# Patient Record
Sex: Female | Born: 1940
Health system: Southern US, Community
[De-identification: ages and names within clinical notes are randomized; demographics above are authoritative.]

## PROBLEM LIST (undated history)

## (undated) DIAGNOSIS — I219 Acute myocardial infarction, unspecified: Secondary | ICD-10-CM

## (undated) DIAGNOSIS — N200 Calculus of kidney: Secondary | ICD-10-CM

## (undated) DIAGNOSIS — E7800 Pure hypercholesterolemia, unspecified: Secondary | ICD-10-CM

## (undated) DIAGNOSIS — Z972 Presence of dental prosthetic device (complete) (partial): Secondary | ICD-10-CM

## (undated) DIAGNOSIS — E119 Type 2 diabetes mellitus without complications: Secondary | ICD-10-CM

## (undated) DIAGNOSIS — Z87442 Personal history of urinary calculi: Secondary | ICD-10-CM

## (undated) DIAGNOSIS — M1990 Unspecified osteoarthritis, unspecified site: Secondary | ICD-10-CM

## (undated) DIAGNOSIS — I10 Essential (primary) hypertension: Secondary | ICD-10-CM

## (undated) DIAGNOSIS — R42 Dizziness and giddiness: Secondary | ICD-10-CM

## (undated) HISTORY — PX: TUBAL LIGATION: SHX77

---

## 2004-10-22 ENCOUNTER — Ambulatory Visit: Payer: Self-pay | Admitting: General Practice

## 2005-12-17 ENCOUNTER — Ambulatory Visit: Payer: Self-pay | Admitting: General Practice

## 2006-10-07 ENCOUNTER — Ambulatory Visit: Payer: Self-pay | Admitting: Gastroenterology

## 2008-01-30 ENCOUNTER — Ambulatory Visit: Payer: Self-pay | Admitting: Family Medicine

## 2008-09-28 ENCOUNTER — Ambulatory Visit: Payer: Self-pay | Admitting: Family Medicine

## 2009-06-01 DIAGNOSIS — I219 Acute myocardial infarction, unspecified: Secondary | ICD-10-CM

## 2009-06-01 HISTORY — DX: Acute myocardial infarction, unspecified: I21.9

## 2009-06-01 HISTORY — PX: CARDIAC CATHETERIZATION: SHX172

## 2009-06-20 ENCOUNTER — Ambulatory Visit: Payer: Self-pay | Admitting: Family Medicine

## 2009-07-02 ENCOUNTER — Ambulatory Visit: Payer: Self-pay | Admitting: Family Medicine

## 2010-10-15 ENCOUNTER — Inpatient Hospital Stay: Payer: Self-pay | Admitting: *Deleted

## 2012-02-28 ENCOUNTER — Observation Stay: Payer: Self-pay | Admitting: Specialist

## 2012-02-28 LAB — URINALYSIS, COMPLETE
Bacteria: NONE SEEN
Blood: NEGATIVE
Glucose,UR: NEGATIVE mg/dL (ref 0–75)
Ketone: NEGATIVE
Protein: NEGATIVE
RBC,UR: 1 /HPF (ref 0–5)
Specific Gravity: 1.019 (ref 1.003–1.030)
Squamous Epithelial: 3
WBC UR: 1 /HPF (ref 0–5)

## 2012-02-28 LAB — COMPREHENSIVE METABOLIC PANEL
Albumin: 3.7 g/dL (ref 3.4–5.0)
Alkaline Phosphatase: 103 U/L (ref 50–136)
BUN: 13 mg/dL (ref 7–18)
Bilirubin,Total: 0.2 mg/dL (ref 0.2–1.0)
Calcium, Total: 8.9 mg/dL (ref 8.5–10.1)
Chloride: 108 mmol/L — ABNORMAL HIGH (ref 98–107)
EGFR (African American): >60
EGFR (Non-African Amer.): >60
Glucose: 156 mg/dL — ABNORMAL HIGH (ref 65–99)
Osmolality: 294 (ref 275–301)
Potassium: 3.6 mmol/L (ref 3.5–5.1)
Sodium: 146 mmol/L — ABNORMAL HIGH (ref 136–145)

## 2012-02-28 LAB — PRO B NATRIURETIC PEPTIDE: B-Type Natriuretic Peptide: 48 pg/mL (ref 0–125)

## 2012-02-28 LAB — CBC
MCH: 28 pg (ref 26.0–34.0)
MCHC: 33.2 g/dL (ref 32.0–36.0)
MCV: 85 fL (ref 80–100)
Platelet: 279 10*3/uL (ref 150–440)
RBC: 4.35 10*6/uL (ref 3.80–5.20)
RDW: 14.1 % (ref 11.5–14.5)

## 2012-02-28 LAB — CK TOTAL AND CKMB (NOT AT ARMC): CK-MB: 1.6 ng/mL (ref 0.5–3.6)

## 2012-03-01 ENCOUNTER — Ambulatory Visit: Payer: Self-pay | Admitting: Specialist

## 2012-03-04 ENCOUNTER — Ambulatory Visit: Payer: Self-pay | Admitting: Internal Medicine

## 2014-09-18 NOTE — H&P (Signed)
PATIENT NAME:  Lenore MannerWILKINS, Matricia M MR#:  [id number] DATE OF BIRTH:  [birth year]  DATE OF ADMISSION:  02/28/2012  PRIMARY CARE PHYSICIAN: Dr. Randa LynnLamb REFERRING PHYSICIAN: Dr. Darnelle CatalanMalinda   ADDENDUM  ALLERGIES: No.   MEDICATIONS:  1. Zocor 40 mg p.o. daily.  2. Plavix 75 mg p.o. daily. 3. Omega 3 fatty acid once daily. 4. Nitroglycerin 0.4 mg 1 tablet sublingual every five minutes p.r.n.  5. Metformin 500 mg p.o. daily.  6. Lopressor 50 mg p.o. 0.5 tablet b.i.d.  7. HCTZ 1 tablet p.o. daily. 8. Ecotrin 325 mg p.o. daily. 9. Norvasc 5 mg p.o. daily.   ____________________________ Shaune PollackQing Damichael Hofman, MD qc:cms D: 02/28/2012 21:22:14 ET T: 02/29/2012 06:29:44 ET JOB#: [id number]  cc: Shaune PollackQing Brendan Gadson, MD, <Dictator> Reola MosherAndrew S. Randa LynnLamb, MD   Shaune PollackQING Breylen Agyeman MD ELECTRONICALLY SIGNED 02/29/2012 15:08

## 2014-09-18 NOTE — H&P (Signed)
PATIENT NAME:  Wendy Odom, Wendy Odom MR#:  [id number] DATE OF BIRTH:  1941/01/26  DATE OF ADMISSION:  02/28/2012  PRIMARY CARE PHYSICIAN: Dr. Randa Lynn REFERRING PHYSICIAN: Dr. Darnelle Catalan  CHIEF COMPLAINT: Passed out today.   HISTORY OF PRESENT ILLNESS: 74 year old African American female with history of coronary artery disease status post stent this year, hypertension, diabetes, hyperlipidemia, history of PVCs, gastroesophageal reflux disease presented to ED with syncope episode today. Patient is alert, awake, oriented in no acute distress. According to patient she went to church today and felt dizzy, wanted to go out but passed out for a few minutes and recovered. Patient has mild headache and dizziness after syncope but denies any seizure, shaking or incontinence. No chest pain, palpitation, orthopnea, or nocturnal dyspnea. Patient denies any dysuria, hematuria. No abdominal pain but has mild nausea. No vomiting or diarrhea.   PAST MEDICAL HISTORY:  1. Hypertension.  2. Diabetes.  3. Coronary artery disease.  4. Gastroesophageal reflux disease.  FAMILY HISTORY: Father had multiple cerebrovascular accident and son with end-stage renal disease and died of status post cardiac arrest.   SOCIAL HISTORY: Denies any smoking, alcohol drinking or illicit drugs.   PAST SURGICAL HISTORY: Cholecystectomy.   REVIEW OF SYSTEMS: CONSTITUTIONAL: Patient denies any fever, chills but has headache, dizziness and weakness. EYES: No double vision, blurred vision. ENT: No postnasal drip, slurred speech or dysphagia. No postnasal drip. CARDIOVASCULAR: No chest pain, palpitation, orthopnea or nocturnal dyspnea. PULMONARY: No cough, sputum, shortness of breath, or hemoptysis. GASTROINTESTINAL: Positive for nausea but no abdominal pain, vomiting, or diarrhea. No melena or bloody stool. GENITOURINARY: No dysuria, hematuria, or incontinence. SKIN: No rash or jaundice. HEMATOLOGY: No easy bruising, bleeding. PSYCH: No depression or  anxiety. NEUROLOGY: Positive for syncope, loss of consciousness, but no seizure but has dizziness and headache.   PHYSICAL EXAMINATION:  VITAL SIGNS: Temperature 97.7, blood pressure was 195/78, now is 184/76, pulse 59, respirations 20, oxygen saturation 97% on room air.   GENERAL: Patient is alert, awake, oriented in no acute distress.   HEENT: Pupils round, equal, reactive to light, accommodation. Moist oral mucosa. Clear oropharynx.   NECK: Supple. No JVD or carotid bruit. No lymphadenopathy. No thyromegaly.   CARDIOVASCULAR: S1, S2 regular rate, rhythm. No murmurs, gallops.   PULMONARY: Bilateral air entry. No wheezing, rales. No use of accessory muscles to breathe.   ABDOMEN: Soft. No distention or tenderness. No organomegaly. Bowel sounds present.   EXTREMITIES: No edema, clubbing or cyanosis. No calf tenderness. Strong bilateral pedal pulses.   SKIN: No rash or jaundice.   NEUROLOGY: Alert and oriented x3. No focal deficit. Power 5/5. Sensation intact.   LABORATORY, DIAGNOSTIC AND RADIOLOGICAL DATA:  CAT scan of head no acute intracranial abnormality.   Urinalysis is negative. BNP 48, CK 187. CBC normal. Glucose 156, BUN 13, creatinine 0.84. Sodium 146, potassium 3.6, chloride 108, bicarbonate 30, troponin less than 0.02. EKG showed sinus rhythm with PVCs at 61 beats per minute.   IMPRESSION:  1. Syncope possibly due to vasovagal reaction.  2. Accelerated hypertension.  3. Diabetes.  4. Coronary artery disease.   PLAN OF TREATMENT:  1. Patient will be placed for observation. Will continue telemonitor. Will get carotid duplex. Patient said she had echocardiogram this year which was normal.  2. We will continue hypertension medication and will give hydralazine p.r.n. IV.  3. Continue aspirin, Plavix, statin.  4. For diabetes we will start sliding scale.  5. GI and deep vein thrombosis prophylaxis. Patient may be  discharged tomorrow if test is negative.   Discussed  patient's situation and plan of treatment with patient and patient's husband.   TIME SPENT: About 45 minutes.   ____________________________ Shaune PollackQing Asencion Loveday, MD qc:cms D: 02/28/2012 21:20:40 ET T: 02/29/2012 06:18:23 ET JOB#: [id number]  cc: Shaune PollackQing Braysen Cloward, MD, <Dictator> Reola MosherAndrew S. Randa LynnLamb, MD  Shaune PollackQING Ondre Salvetti MD ELECTRONICALLY SIGNED 02/29/2012 15:08

## 2014-09-18 NOTE — Discharge Summary (Signed)
PATIENT NAME:  Wendy Odom, Wendy Odom MR#:  [id number] DATE OF BIRTH:  1941-02-29  DATE OF ADMISSION:  02/28/2012 DATE OF DISCHARGE:  02/29/2012  HISTORY AND PHYSICAL: For a detailed note, please take a look at the History and Physical done on admission by Dr. Imogene Burnhen.   DISCHARGE DIAGNOSES:  1. Syncope, likely vasovagal in nature.  2. Hypertension.  3. Diabetes.  4. Hyperlipidemia.   DIET: The patient is being discharged on an American Diabetic Association low sodium, low fat diet.   ACTIVITY: As tolerated.   FOLLOWUP: Follow up with Dr. Fidela JuneauAndy Lamb in the next 1 to 2 weeks.    DISCHARGE MEDICATIONS:  1. Sublingual nitroglycerin as needed.  2. Amlodipine 5 mg daily.  3. Aspirin 325 mg daily.  4. HCTZ/triamterene 1 tab daily.  5. Metoprolol 50 mg b.i.d.  6. Metformin 500 mg daily.  7. Fish oil 1000 mg daily.  8. Plavix 75 mg daily.  9. Simvastatin 40 mg at bedtime.   PERTINENT HOSPITAL LABORATORY, DIAGNOSTIC AND RADIOLOGICAL DATA: A CT scan of the head done on admission showed no acute intracranial process. A chest x-ray also done on admission showed no evidence of any acute cardiopulmonary pulmonary disease.   BRIEF HOSPITAL COURSE: The patient is a 74 year old female with past medical history as mentioned above, presented to the hospital after a syncopal episode at church yesterday.   1. Syncope: The exact etiology of the syncope is unclear but likely thought to be related to vasovagal syncope. The patient was observed overnight on telemetry, did not have any evidence of any acute cardiac arrhythmias. Her CT of the head was negative. She had a recent echocardiogram done at Dr. Darrold JunkerParaschos'  office on 09/26 which showed normal ejection fraction of 55%. She was supposed to get a carotid duplex but was accidently discharge by the Nursing Staff prior to getting a carotid duplex done; therefore, I am arranging for a carotid duplex to be done as an outpatient on tomorrow. She also had orthostatics  checked which were negative. Since she was clinically feeling fine she was discharged.  2. Hypertension: The patient remained hemodynamically stable, did not have any evidence of orthostasis. She will resume her amlodipine, metoprolol, hydrochlorothiazide, and triamterene as stated.  3. Diabetes: The patient was maintained on her metformin and sliding scale insulin. She will resume that.  4. Hyperlipidemia: The patient was maintained on simvastatin. She will resume that.   As mentioned, she was going to be discharged home after she had a carotid duplex, but she was accidentally discharged by Nursing staff before having the carotid duplex done; therefore, it is being arranged for her as an outpatient for tomorrow on 03/01/2012.   CODE STATUS:  The patient is a FULL CODE.    TIME SPENT: 40 minutes.  ____________________________ Rolly PancakeVivek J. Cherlynn KaiserSainani, MD vjs:cbb D: 02/29/2012 14:32:39 ET T: 03/01/2012 10:22:00 ET JOB#: [id number]  cc: Rolly PancakeVivek J. Cherlynn KaiserSainani, MD, <Dictator> Reola MosherAndrew S. Randa LynnLamb, MD Houston SirenVIVEK J SAINANI MD ELECTRONICALLY SIGNED 03/02/2012 12:42

## 2015-05-06 ENCOUNTER — Other Ambulatory Visit: Payer: Self-pay | Admitting: Family Medicine

## 2015-05-06 DIAGNOSIS — Z1231 Encounter for screening mammogram for malignant neoplasm of breast: Secondary | ICD-10-CM

## 2015-05-15 ENCOUNTER — Ambulatory Visit: Payer: Self-pay

## 2015-05-28 ENCOUNTER — Ambulatory Visit
Admission: RE | Admit: 2015-05-28 | Discharge: 2015-05-28 | Disposition: A | Discharge status: Home / Self Care | Payer: BC Managed Care – PPO | Source: Ambulatory Visit | Attending: Family Medicine | Admitting: Family Medicine

## 2015-05-28 DIAGNOSIS — Z1231 Encounter for screening mammogram for malignant neoplasm of breast: Secondary | ICD-10-CM | POA: Diagnosis present

## 2015-06-11 ENCOUNTER — Other Ambulatory Visit: Payer: Self-pay | Admitting: Family Medicine

## 2015-06-11 DIAGNOSIS — N63 Unspecified lump in unspecified breast: Secondary | ICD-10-CM

## 2017-07-22 ENCOUNTER — Encounter: Payer: Self-pay | Admitting: *Deleted

## 2017-07-22 ENCOUNTER — Other Ambulatory Visit: Payer: Self-pay

## 2017-07-27 NOTE — Discharge Instructions (Signed)

## 2017-07-28 ENCOUNTER — Ambulatory Visit: Admit: 2017-07-28 | Payer: BC Managed Care – PPO | Admitting: Ophthalmology

## 2017-07-28 ENCOUNTER — Encounter
Admission: RE | Disposition: A | Discharge status: Home / Self Care | Payer: Self-pay | Source: Ambulatory Visit | Attending: Ophthalmology

## 2017-07-28 ENCOUNTER — Ambulatory Visit
Admission: RE | Admit: 2017-07-28 | Discharge: 2017-07-28 | Disposition: A | Discharge status: Home / Self Care | Payer: BC Managed Care – PPO | Source: Ambulatory Visit | Attending: Ophthalmology | Admitting: Ophthalmology

## 2017-07-28 ENCOUNTER — Ambulatory Visit: Payer: BC Managed Care – PPO | Admitting: Anesthesiology

## 2017-07-28 DIAGNOSIS — I10 Essential (primary) hypertension: Secondary | ICD-10-CM | POA: Insufficient documentation

## 2017-07-28 DIAGNOSIS — Z955 Presence of coronary angioplasty implant and graft: Secondary | ICD-10-CM | POA: Insufficient documentation

## 2017-07-28 DIAGNOSIS — Z87891 Personal history of nicotine dependence: Secondary | ICD-10-CM | POA: Insufficient documentation

## 2017-07-28 DIAGNOSIS — I2510 Atherosclerotic heart disease of native coronary artery without angina pectoris: Secondary | ICD-10-CM | POA: Insufficient documentation

## 2017-07-28 DIAGNOSIS — E7800 Pure hypercholesterolemia, unspecified: Secondary | ICD-10-CM | POA: Insufficient documentation

## 2017-07-28 DIAGNOSIS — I252 Old myocardial infarction: Secondary | ICD-10-CM | POA: Insufficient documentation

## 2017-07-28 DIAGNOSIS — E119 Type 2 diabetes mellitus without complications: Secondary | ICD-10-CM | POA: Insufficient documentation

## 2017-07-28 DIAGNOSIS — M1990 Unspecified osteoarthritis, unspecified site: Secondary | ICD-10-CM | POA: Diagnosis not present

## 2017-07-28 DIAGNOSIS — R42 Dizziness and giddiness: Secondary | ICD-10-CM | POA: Diagnosis not present

## 2017-07-28 DIAGNOSIS — H2511 Age-related nuclear cataract, right eye: Secondary | ICD-10-CM | POA: Insufficient documentation

## 2017-07-28 DIAGNOSIS — Z87442 Personal history of urinary calculi: Secondary | ICD-10-CM | POA: Insufficient documentation

## 2017-07-28 HISTORY — DX: Pure hypercholesterolemia, unspecified: E78.00

## 2017-07-28 HISTORY — PX: CATARACT EXTRACTION W/PHACO: SHX586

## 2017-07-28 HISTORY — DX: Unspecified osteoarthritis, unspecified site: M19.90

## 2017-07-28 HISTORY — DX: Acute myocardial infarction, unspecified: I21.9

## 2017-07-28 HISTORY — DX: Calculus of kidney: N20.0

## 2017-07-28 HISTORY — DX: Presence of dental prosthetic device (complete) (partial): Z97.2

## 2017-07-28 HISTORY — DX: Type 2 diabetes mellitus without complications: E11.9

## 2017-07-28 HISTORY — DX: Essential (primary) hypertension: I10

## 2017-07-28 HISTORY — DX: Dizziness and giddiness: R42

## 2017-07-28 LAB — GLUCOSE, CAPILLARY
GLUCOSE-CAPILLARY: 183 mg/dL — AB (ref 65–99)
Glucose-Capillary: 207 mg/dL — ABNORMAL HIGH (ref 65–99)

## 2017-07-28 SURGERY — PHACOEMULSIFICATION, CATARACT, WITH IOL INSERTION
Anesthesia: Choice | Laterality: Right

## 2017-07-28 SURGERY — PHACOEMULSIFICATION, CATARACT, WITH IOL INSERTION
Anesthesia: Monitor Anesthesia Care | Site: Eye | Laterality: Right | Wound class: Clean

## 2017-07-28 MED ORDER — FENTANYL CITRATE (PF) 100 MCG/2ML IJ SOLN
INTRAMUSCULAR | Status: DC | PRN
  Administered 2017-07-28: 50 ug via INTRAVENOUS

## 2017-07-28 MED ORDER — LACTATED RINGERS IV SOLN: INTRAVENOUS | Status: DC

## 2017-07-28 MED ORDER — ACETAMINOPHEN 325 MG PO TABS: 650.0000 mg | ORAL_TABLET | Freq: Once | ORAL | Status: DC | PRN

## 2017-07-28 MED ORDER — NA HYALUR & NA CHOND-NA HYALUR 0.4-0.35 ML IO KIT
PACK | INTRAOCULAR | Status: DC | PRN
  Administered 2017-07-28: 1 mL via INTRAOCULAR

## 2017-07-28 MED ORDER — BRIMONIDINE TARTRATE-TIMOLOL 0.2-0.5 % OP SOLN
OPHTHALMIC | Status: DC | PRN
  Administered 2017-07-28: 1 [drp] via OPHTHALMIC

## 2017-07-28 MED ORDER — BSS IO SOLN
INTRAOCULAR | Status: DC | PRN
  Administered 2017-07-28: 51 mL via OPHTHALMIC

## 2017-07-28 MED ORDER — ONDANSETRON HCL 4 MG/2ML IJ SOLN: 4.0000 mg | Freq: Once | INTRAMUSCULAR | Status: DC | PRN

## 2017-07-28 MED ORDER — MIDAZOLAM HCL 2 MG/2ML IJ SOLN
INTRAMUSCULAR | Status: DC | PRN
  Administered 2017-07-28: 1 mg via INTRAVENOUS

## 2017-07-28 MED ORDER — LIDOCAINE HCL (PF) 2 % IJ SOLN
INTRAOCULAR | Status: DC | PRN
  Administered 2017-07-28: 2 mL via INTRAMUSCULAR

## 2017-07-28 MED ORDER — MOXIFLOXACIN HCL 0.5 % OP SOLN
1.0000 [drp] | OPHTHALMIC | Status: DC | PRN
  Administered 2017-07-28 (×3): 1 [drp] via OPHTHALMIC

## 2017-07-28 MED ORDER — ARMC OPHTHALMIC DILATING DROPS
1.0000 "application " | OPHTHALMIC | Status: DC | PRN
  Administered 2017-07-28 (×3): 1 via OPHTHALMIC

## 2017-07-28 MED ORDER — ACETAMINOPHEN 160 MG/5ML PO SOLN: 325.0000 mg | ORAL | Status: DC | PRN

## 2017-07-28 MED ORDER — CEFUROXIME OPHTHALMIC INJECTION 1 MG/0.1 ML
INJECTION | OPHTHALMIC | Status: DC | PRN
  Administered 2017-07-28: .3 mL via OPHTHALMIC

## 2017-07-28 SURGICAL SUPPLY — 25 items
CANNULA ANT/CHMB 27GA (MISCELLANEOUS) ×3 IMPLANT
CARTRIDGE ABBOTT (MISCELLANEOUS) IMPLANT
GLOVE SURG LX 7.5 STRW (GLOVE) ×2
GLOVE SURG LX STRL 7.5 STRW (GLOVE) ×1 IMPLANT
GLOVE SURG TRIUMPH 8.0 PF LTX (GLOVE) ×3 IMPLANT
GOWN STRL REUS W/ TWL LRG LVL3 (GOWN DISPOSABLE) ×2 IMPLANT
GOWN STRL REUS W/TWL LRG LVL3 (GOWN DISPOSABLE) ×4
LENS IOL TECNIS ITEC 21.0 (Intraocular Lens) ×3 IMPLANT
MARKER SKIN DUAL TIP RULER LAB (MISCELLANEOUS) ×3 IMPLANT
NDL RETROBULBAR .5 NSTRL (NEEDLE) IMPLANT
NEEDLE FILTER BLUNT 18X 1/2SAF (NEEDLE) ×2
NEEDLE FILTER BLUNT 18X1 1/2 (NEEDLE) ×1 IMPLANT
PACK CATARACT BRASINGTON (MISCELLANEOUS) ×3 IMPLANT
PACK EYE AFTER SURG (MISCELLANEOUS) ×3 IMPLANT
PACK OPTHALMIC (MISCELLANEOUS) ×3 IMPLANT
RING MALYGIN 7.0 (MISCELLANEOUS) IMPLANT
SUT ETHILON 10-0 CS-B-6CS-B-6 (SUTURE)
SUT VICRYL  9 0 (SUTURE)
SUT VICRYL 9 0 (SUTURE) IMPLANT
SUTURE EHLN 10-0 CS-B-6CS-B-6 (SUTURE) IMPLANT
SYR 3ML LL SCALE MARK (SYRINGE) ×3 IMPLANT
SYR 5ML LL (SYRINGE) ×3 IMPLANT
SYR TB 1ML LUER SLIP (SYRINGE) ×3 IMPLANT
WATER STERILE IRR 500ML POUR (IV SOLUTION) ×3 IMPLANT
WIPE NON LINTING 3.25X3.25 (MISCELLANEOUS) ×3 IMPLANT

## 2017-07-28 NOTE — Transfer of Care (Signed)
Immediate Anesthesia Transfer of Care Note  Patient: Wendy Odom  Procedure(s) Performed: CATARACT EXTRACTION PHACO AND INTRAOCULAR LENS PLACEMENT (IOC) RIGHT DIABETIC (Right Eye)  Patient Location: PACU  Anesthesia Type: MAC  Level of Consciousness: awake, alert  and patient cooperative  Airway and Oxygen Therapy: Patient Spontanous Breathing and Patient connected to supplemental oxygen  Post-op Assessment: Post-op Vital signs reviewed, Patient's Cardiovascular Status Stable, Respiratory Function Stable, Patent Airway and No signs of Nausea or vomiting  Post-op Vital Signs: Reviewed and stable  Complications: No apparent anesthesia complications

## 2017-07-28 NOTE — Anesthesia Preprocedure Evaluation (Signed)
Anesthesia Evaluation  Patient identified by MRN, date of birth, ID band Patient awake    Reviewed: Allergy & Precautions, NPO status , Patient's Chart, lab work & pertinent test results  Airway Mallampati: III  TM Distance: >3 FB Neck ROM: Full    Dental  (+)    Pulmonary former smoker (quit 2003),    Pulmonary exam normal breath sounds clear to auscultation       Cardiovascular Exercise Tolerance: Good hypertension, + CAD, + Past MI (2011) and + Cardiac Stents (2011, on plavix)  Normal cardiovascular exam Rhythm:Regular Rate:Normal     Neuro/Psych Vertigo     GI/Hepatic negative GI ROS,   Endo/Other  diabetes, Type 2  Renal/GU Renal disease (hx nephrolithiasis)     Musculoskeletal  (+) Arthritis , Osteoarthritis,    Abdominal   Peds  Hematology negative hematology ROS (+)   Anesthesia Other Findings   Reproductive/Obstetrics                             Anesthesia Physical Anesthesia Plan  ASA: III  Anesthesia Plan: MAC   Post-op Pain Management:    Induction: Intravenous  PONV Risk Score and Plan: 2 and Midazolam and TIVA  Airway Management Planned: Natural Airway  Additional Equipment:   Intra-op Plan:   Post-operative Plan:   Informed Consent: I have reviewed the patients History and Physical, chart, labs and discussed the procedure including the risks, benefits and alternatives for the proposed anesthesia with the patient or authorized representative who has indicated his/her understanding and acceptance.     Plan Discussed with: CRNA  Anesthesia Plan Comments:         Anesthesia Quick Evaluation

## 2017-07-28 NOTE — Anesthesia Procedure Notes (Signed)
Procedure Name: MAC Performed by: Lind Guest, CRNA Pre-anesthesia Checklist: Patient identified, Emergency Drugs available, Suction available, Timeout performed and Patient being monitored Patient Re-evaluated:Patient Re-evaluated prior to induction Oxygen Delivery Method: Nasal cannula

## 2017-07-28 NOTE — H&P (Signed)
The History and Physical notes are on paper, have been signed, and are to be scanned. The patient remains stable and unchanged from the H&P.   Previous H&P reviewed, patient examined, and there are no changes.  Wendy Odom 07/28/2017 8:15 AM

## 2017-07-28 NOTE — Op Note (Signed)
LOCATION:  [facility]   PREOPERATIVE DIAGNOSIS:    Nuclear sclerotic cataract right eye. H25.11   POSTOPERATIVE DIAGNOSIS:  Nuclear sclerotic cataract right eye.     PROCEDURE:  Phacoemusification with posterior chamber intraocular lens placement of the right eye   LENS:   Implant Name Type Inv. Item Serial No. Manufacturer Lot No. LRB No. Used  LENS IOL DIOP 21.0 - [id number] Intraocular Lens LENS IOL DIOP 21.0 [id number] AMO  Right 1        ULTRASOUND TIME: 20 % of 1 minutes, 6 seconds.  CDE 13.0   SURGEON:  Deirdre Evenerhadwick R. Elissa Grieshop, MD   ANESTHESIA:  Topical with tetracaine drops and 2% Xylocaine jelly, augmented with 1% preservative-free intracameral lidocaine.    COMPLICATIONS:  None.   DESCRIPTION OF PROCEDURE:  The patient was identified in the holding room and transported to the operating room and placed in the supine position under the operating microscope.  The right eye was identified as the operative eye and it was prepped and draped in the usual sterile ophthalmic fashion.   A 1 millimeter clear-corneal paracentesis was made at the 12:00 position.  0.5 ml of preservative-free 1% lidocaine was injected into the anterior chamber. The anterior chamber was filled with Viscoat viscoelastic.  A 2.4 millimeter keratome was used to make a near-clear corneal incision at the 9:00 position.  A curvilinear capsulorrhexis was made with a cystotome and capsulorrhexis forceps.  Balanced salt solution was used to hydrodissect and hydrodelineate the nucleus.   Phacoemulsification was then used in stop and chop fashion to remove the lens nucleus and epinucleus.  The remaining cortex was then removed using the irrigation and aspiration handpiece. Provisc was then placed into the capsular bag to distend it for lens placement.  A lens was then injected into the capsular bag.  The remaining viscoelastic was aspirated.   Wounds were hydrated with balanced salt solution.  The anterior  chamber was inflated to a physiologic pressure with balanced salt solution.  No wound leaks were noted. Cefuroxime 0.1 ml of a 10mg /ml solution was injected into the anterior chamber for a dose of 1 mg of intracameral antibiotic at the completion of the case.   Timolol and Brimonidine drops were applied to the eye.  The patient was taken to the recovery room in stable condition without complications of anesthesia or surgery.   Tayte Mcwherter 07/28/2017, 8:54 AM

## 2017-07-28 NOTE — Anesthesia Postprocedure Evaluation (Signed)
Anesthesia Post Note  Patient: Wendy Odom  Procedure(s) Performed: CATARACT EXTRACTION PHACO AND INTRAOCULAR LENS PLACEMENT (IOC) RIGHT DIABETIC (Right Eye)  Patient location during evaluation: PACU Anesthesia Type: MAC Level of consciousness: awake and alert, oriented and patient cooperative Pain management: pain level controlled Vital Signs Assessment: post-procedure vital signs reviewed and stable Respiratory status: spontaneous breathing, nonlabored ventilation and respiratory function stable Cardiovascular status: blood pressure returned to baseline and stable Postop Assessment: adequate PO intake Anesthetic complications: no    Darrin Nipper

## 2017-12-07 DIAGNOSIS — E113211 Type 2 diabetes mellitus with mild nonproliferative diabetic retinopathy with macular edema, right eye: Secondary | ICD-10-CM | POA: Diagnosis not present

## 2017-12-07 DIAGNOSIS — Z961 Presence of intraocular lens: Secondary | ICD-10-CM | POA: Diagnosis not present

## 2018-03-21 DIAGNOSIS — E7800 Pure hypercholesterolemia, unspecified: Secondary | ICD-10-CM | POA: Diagnosis not present

## 2018-03-21 DIAGNOSIS — Z79899 Other long term (current) drug therapy: Secondary | ICD-10-CM | POA: Diagnosis not present

## 2018-03-21 DIAGNOSIS — E1165 Type 2 diabetes mellitus with hyperglycemia: Secondary | ICD-10-CM | POA: Diagnosis not present

## 2018-03-25 DIAGNOSIS — Z79899 Other long term (current) drug therapy: Secondary | ICD-10-CM | POA: Diagnosis not present

## 2018-03-25 DIAGNOSIS — I2510 Atherosclerotic heart disease of native coronary artery without angina pectoris: Secondary | ICD-10-CM | POA: Diagnosis not present

## 2018-03-25 DIAGNOSIS — I10 Essential (primary) hypertension: Secondary | ICD-10-CM | POA: Diagnosis not present

## 2018-03-25 DIAGNOSIS — E7800 Pure hypercholesterolemia, unspecified: Secondary | ICD-10-CM | POA: Diagnosis not present

## 2018-03-25 DIAGNOSIS — E1165 Type 2 diabetes mellitus with hyperglycemia: Secondary | ICD-10-CM | POA: Diagnosis not present

## 2020-01-09 DIAGNOSIS — S83412D Sprain of medial collateral ligament of left knee, subsequent encounter: Secondary | ICD-10-CM | POA: Diagnosis not present

## 2020-01-09 DIAGNOSIS — M25462 Effusion, left knee: Secondary | ICD-10-CM | POA: Diagnosis not present

## 2020-01-09 DIAGNOSIS — G8929 Other chronic pain: Secondary | ICD-10-CM | POA: Diagnosis not present

## 2020-01-09 DIAGNOSIS — W010XXD Fall on same level from slipping, tripping and stumbling without subsequent striking against object, subsequent encounter: Secondary | ICD-10-CM | POA: Diagnosis not present

## 2020-01-09 DIAGNOSIS — M76892 Other specified enthesopathies of left lower limb, excluding foot: Secondary | ICD-10-CM | POA: Diagnosis not present

## 2020-01-09 DIAGNOSIS — M25562 Pain in left knee: Secondary | ICD-10-CM | POA: Diagnosis not present

## 2020-01-09 DIAGNOSIS — M1712 Unilateral primary osteoarthritis, left knee: Secondary | ICD-10-CM | POA: Diagnosis not present

## 2020-01-25 DIAGNOSIS — M1712 Unilateral primary osteoarthritis, left knee: Secondary | ICD-10-CM | POA: Diagnosis not present

## 2020-01-25 DIAGNOSIS — S83412D Sprain of medial collateral ligament of left knee, subsequent encounter: Secondary | ICD-10-CM | POA: Diagnosis not present

## 2020-01-25 DIAGNOSIS — W010XXD Fall on same level from slipping, tripping and stumbling without subsequent striking against object, subsequent encounter: Secondary | ICD-10-CM | POA: Diagnosis not present

## 2020-01-25 DIAGNOSIS — M76892 Other specified enthesopathies of left lower limb, excluding foot: Secondary | ICD-10-CM | POA: Diagnosis not present

## 2020-01-25 DIAGNOSIS — G8929 Other chronic pain: Secondary | ICD-10-CM | POA: Diagnosis not present

## 2020-01-25 DIAGNOSIS — M25462 Effusion, left knee: Secondary | ICD-10-CM | POA: Diagnosis not present

## 2020-01-25 DIAGNOSIS — M25562 Pain in left knee: Secondary | ICD-10-CM | POA: Diagnosis not present

## 2020-01-26 ENCOUNTER — Other Ambulatory Visit: Payer: Self-pay | Admitting: Sports Medicine

## 2020-01-26 DIAGNOSIS — W19XXXD Unspecified fall, subsequent encounter: Secondary | ICD-10-CM

## 2020-01-26 DIAGNOSIS — S83412D Sprain of medial collateral ligament of left knee, subsequent encounter: Secondary | ICD-10-CM

## 2020-01-26 DIAGNOSIS — M25462 Effusion, left knee: Secondary | ICD-10-CM

## 2020-01-26 DIAGNOSIS — M25562 Pain in left knee: Secondary | ICD-10-CM

## 2020-01-26 DIAGNOSIS — M1712 Unilateral primary osteoarthritis, left knee: Secondary | ICD-10-CM

## 2020-01-26 DIAGNOSIS — M76892 Other specified enthesopathies of left lower limb, excluding foot: Secondary | ICD-10-CM

## 2020-02-02 DIAGNOSIS — N182 Chronic kidney disease, stage 2 (mild): Secondary | ICD-10-CM | POA: Diagnosis not present

## 2020-02-02 DIAGNOSIS — E1165 Type 2 diabetes mellitus with hyperglycemia: Secondary | ICD-10-CM | POA: Diagnosis not present

## 2020-02-02 DIAGNOSIS — E7800 Pure hypercholesterolemia, unspecified: Secondary | ICD-10-CM | POA: Diagnosis not present

## 2020-02-02 DIAGNOSIS — Z79899 Other long term (current) drug therapy: Secondary | ICD-10-CM | POA: Diagnosis not present

## 2020-02-02 DIAGNOSIS — E1122 Type 2 diabetes mellitus with diabetic chronic kidney disease: Secondary | ICD-10-CM | POA: Diagnosis not present

## 2020-02-09 DIAGNOSIS — E119 Type 2 diabetes mellitus without complications: Secondary | ICD-10-CM | POA: Diagnosis not present

## 2020-02-09 DIAGNOSIS — E785 Hyperlipidemia, unspecified: Secondary | ICD-10-CM | POA: Diagnosis not present

## 2020-02-09 DIAGNOSIS — I2510 Atherosclerotic heart disease of native coronary artery without angina pectoris: Secondary | ICD-10-CM | POA: Diagnosis not present

## 2020-02-09 DIAGNOSIS — I10 Essential (primary) hypertension: Secondary | ICD-10-CM | POA: Diagnosis not present

## 2020-02-16 ENCOUNTER — Other Ambulatory Visit: Payer: Self-pay

## 2020-02-16 ENCOUNTER — Ambulatory Visit
Admission: RE | Admit: 2020-02-16 | Discharge: 2020-02-16 | Disposition: A | Discharge status: Home / Self Care | Payer: Medicare HMO | Source: Ambulatory Visit | Attending: Sports Medicine | Admitting: Sports Medicine

## 2020-02-16 DIAGNOSIS — M25462 Effusion, left knee: Secondary | ICD-10-CM | POA: Insufficient documentation

## 2020-02-16 DIAGNOSIS — S83282A Other tear of lateral meniscus, current injury, left knee, initial encounter: Secondary | ICD-10-CM | POA: Diagnosis not present

## 2020-02-16 DIAGNOSIS — S83412D Sprain of medial collateral ligament of left knee, subsequent encounter: Secondary | ICD-10-CM | POA: Diagnosis not present

## 2020-02-16 DIAGNOSIS — M1712 Unilateral primary osteoarthritis, left knee: Secondary | ICD-10-CM | POA: Insufficient documentation

## 2020-02-16 DIAGNOSIS — M76892 Other specified enthesopathies of left lower limb, excluding foot: Secondary | ICD-10-CM | POA: Diagnosis not present

## 2020-02-16 DIAGNOSIS — S83242A Other tear of medial meniscus, current injury, left knee, initial encounter: Secondary | ICD-10-CM | POA: Diagnosis not present

## 2020-02-16 DIAGNOSIS — W19XXXD Unspecified fall, subsequent encounter: Secondary | ICD-10-CM | POA: Insufficient documentation

## 2020-02-16 DIAGNOSIS — M25562 Pain in left knee: Secondary | ICD-10-CM

## 2020-02-16 DIAGNOSIS — G8929 Other chronic pain: Secondary | ICD-10-CM | POA: Diagnosis not present

## 2020-03-04 DIAGNOSIS — M1712 Unilateral primary osteoarthritis, left knee: Secondary | ICD-10-CM | POA: Diagnosis not present

## 2020-03-06 ENCOUNTER — Other Ambulatory Visit: Payer: Self-pay | Admitting: Orthopedic Surgery

## 2020-03-08 ENCOUNTER — Other Ambulatory Visit: Payer: Self-pay

## 2020-03-08 ENCOUNTER — Encounter
Admission: RE | Admit: 2020-03-08 | Discharge: 2020-03-08 | Disposition: A | Discharge status: Home / Self Care | Payer: Medicare HMO | Source: Ambulatory Visit | Attending: Orthopedic Surgery | Admitting: Orthopedic Surgery

## 2020-03-08 DIAGNOSIS — Z20822 Contact with and (suspected) exposure to covid-19: Secondary | ICD-10-CM | POA: Diagnosis not present

## 2020-03-08 DIAGNOSIS — Z01818 Encounter for other preprocedural examination: Secondary | ICD-10-CM | POA: Insufficient documentation

## 2020-03-08 DIAGNOSIS — Z7902 Long term (current) use of antithrombotics/antiplatelets: Secondary | ICD-10-CM | POA: Diagnosis not present

## 2020-03-08 DIAGNOSIS — M1712 Unilateral primary osteoarthritis, left knee: Secondary | ICD-10-CM | POA: Diagnosis not present

## 2020-03-08 HISTORY — DX: Personal history of urinary calculi: Z87.442

## 2020-03-08 LAB — CBC WITH DIFFERENTIAL/PLATELET
Abs Immature Granulocytes: 0.02 10*3/uL (ref 0.00–0.07)
Basophils Absolute: 0.1 10*3/uL (ref 0.0–0.1)
Basophils Relative: 1 %
Eosinophils Absolute: 0.1 10*3/uL (ref 0.0–0.5)
Eosinophils Relative: 2 %
HCT: 38.7 % (ref 36.0–46.0)
Hemoglobin: 12.5 g/dL (ref 12.0–15.0)
Immature Granulocytes: 0 %
Lymphocytes Relative: 38 %
Lymphs Abs: 2.6 10*3/uL (ref 0.7–4.0)
MCH: 28.2 pg (ref 26.0–34.0)
MCHC: 32.3 g/dL (ref 30.0–36.0)
MCV: 87.2 fL (ref 80.0–100.0)
Monocytes Absolute: 0.5 10*3/uL (ref 0.1–1.0)
Monocytes Relative: 7 %
Neutro Abs: 3.6 10*3/uL (ref 1.7–7.7)
Neutrophils Relative %: 52 %
Platelets: 315 10*3/uL (ref 150–400)
RBC: 4.44 MIL/uL (ref 3.87–5.11)
RDW: 13.8 % (ref 11.5–15.5)
WBC: 6.9 10*3/uL (ref 4.0–10.5)
nRBC: 0 % (ref 0.0–0.2)

## 2020-03-08 LAB — TYPE AND SCREEN
ABO/RH(D): O POS
Antibody Screen: NEGATIVE

## 2020-03-08 LAB — COMPREHENSIVE METABOLIC PANEL
ALT: 13 U/L (ref 0–44)
AST: 15 U/L (ref 15–41)
Albumin: 3.8 g/dL (ref 3.5–5.0)
Alkaline Phosphatase: 76 U/L (ref 38–126)
Anion gap: 11 (ref 5–15)
BUN: 11 mg/dL (ref 8–23)
CO2: 28 mmol/L (ref 22–32)
Calcium: 9.5 mg/dL (ref 8.9–10.3)
Chloride: 103 mmol/L (ref 98–111)
Creatinine, Ser: 0.64 mg/dL (ref 0.44–1.00)
GFR, Estimated: >60 mL/min (ref >60)
Glucose, Bld: 220 mg/dL — ABNORMAL HIGH (ref 70–99)
Potassium: 3.8 mmol/L (ref 3.5–5.1)
Sodium: 142 mmol/L (ref 135–145)
Total Bilirubin: 0.4 mg/dL (ref 0.3–1.2)
Total Protein: 6.7 g/dL (ref 6.5–8.1)

## 2020-03-08 LAB — SURGICAL PCR SCREEN
MRSA, PCR: NEGATIVE
Staphylococcus aureus: NEGATIVE

## 2020-03-08 LAB — URINALYSIS, ROUTINE W REFLEX MICROSCOPIC
Bilirubin Urine: NEGATIVE
Glucose, UA: 50 mg/dL — AB
Hgb urine dipstick: NEGATIVE
Ketones, ur: NEGATIVE mg/dL
Leukocytes,Ua: NEGATIVE
Nitrite: NEGATIVE
Protein, ur: NEGATIVE mg/dL
Specific Gravity, Urine: 1.006 (ref 1.005–1.030)
pH: 5 (ref 5.0–8.0)

## 2020-03-08 NOTE — Progress Notes (Signed)
  [facility] Perioperative Services: Pre-Admission/Anesthesia Testing     Date: 03/08/20  Name: Wendy Odom MRN:   [id number]  Re: DAPT therapy and plans for surgery   Case: [id number] Date/Time: 03/12/20 0700   Procedure: Left partial knee replacement (Left Knee)   Anesthesia type: Choice   Pre-op diagnosis: Primary osteoarthritis of left knee M17.12   Location: [facility] OR ROOM 01 / [facility] ORS FOR ANESTHESIA GROUP   Surgeons: Kennedy Bucker, MD     Patient scheduled for the above procedure on 03/12/2020 with Dr. Kennedy Bucker.  Patient presents to PAT clinic today for presurgical interview and testing.  During the course of her interview it was discovered that patient continues on DAPT (ASA + clopidogrel) therapy.  These medications are being managed by her PCP Madelin Rear, MD).  Patient advising that she was never told to hold either of these medications prior to her upcoming surgical procedure.  Call placed to the PCPs office, however no return call received.  Communication sent to attending surgeon (see below).  Patient updated on recommendations from orthopedic surgeon regarding DAPT medications.     Quentin Mulling, MSN, APRN, FNP-C, CEN [facility]  Peri-operative Services Nurse Practitioner Phone: [phone] 03/08/20 3:19 PM

## 2020-03-08 NOTE — Progress Notes (Signed)
  [facility] Perioperative Services: Pre-Admission/Anesthesia Testing  Abnormal Lab Notification    Date: 03/08/20  Name: Wendy Odom MRN:   [id number]  Re: Abnormal labs noted during PAT appointment   Provider(s) Notified: Kennedy Bucker, MD Notification mode: Routed and/or faxed via [facility]   ABNORMAL LAB VALUE(S): Lab Results  Component Value Date   GLUCOSE 220 (H) 03/08/2020    Notes: Patient with a T2DM diagnosis. She is currently on oral (glipizide and metformin) Therapy. Last Hgb A1c was 8.0% on 02/02/2020. In review of PCP's notes, while A1c is not at ideal goal on current therapy, level is not high enough where he would consider increasing/adding medications at this point. This communication is being sent in order to determine if patient is deemed to have adequate preoperative glycemic control in efforts to reduce her risk of developing SSI/PJI.   Marland Kitchen The odds ratio for SSI/PJI infection is between 2.8 and 3.4 for orthopedic surgery patients with pre-operative serum glucose levels of > 125 mg/dL or a post-operative levels of > 200 mg/dL [facility] & Imogene Burn, [id number]).    . Data suggests that a Hgb A1c threshold of 7.7% tends to be more indicative of infection than the commonly used 7% and should perhaps be the pre-operative patient optimization goal (Tarabichi et al., 2017).   With that being said, the benefit of improving glycemic control must be weighed against the overall risk associated with delaying a necessary elective orthopedic surgery for this patient. This is a Personal assistant; no formal response is required.  Citations: Hilbert Odor, A.F. Reducing the risk of infection after total joint arthroplasty: preoperative optimization. Arthroplasty 1, 4 (2019). [email or web address]  Holley Raring MM, Adelani M, Brigati D, [place] SM, [address] [place] JC, [place], [place], [place], [place].  Determining the Threshold for HbA1c as a Predictor for Adverse Outcomes After Total Joint Arthroplasty: A Multicenter, Retrospective Study. J Arthroplasty. 2017 Sep;32(9S):S263-S267.e1. [email or web address].   Quentin Mulling, MSN, APRN, FNP-C, CEN [facility]  Peri-operative Services Nurse Practitioner Phone: [phone] 03/08/20 4:05 PM

## 2020-03-08 NOTE — Patient Instructions (Addendum)
Your procedure is scheduled on: Tues 10/12 Report to Day Surgery. To find out your arrival time please call [phone] between 1PM - 3PM on Mon 10/11.  Remember: Instructions that are not followed completely may result in serious medical risk,  up to and including death, or upon the discretion of your surgeon and anesthesiologist your  surgery may need to be rescheduled.     _X__ 1. Do not eat food after midnight the night before your procedure.                 No chewing gum or hard candies. You may drink clear liquids up to 2 hours                 before you are scheduled to arrive for your surgery- DO not drink clear                 liquids within 2 hours of the start of your surgery.                 Clear Liquids include:  water, G2 or                  Gatorade Zero (avoid Red/Purple/Blue), Black Coffee or Tea (Do not add                 anything to coffee or tea). ___x__2.   Complete the  G2  hours before you arrive  __X__2.  On the morning of surgery brush your teeth with toothpaste and water, you                may rinse your mouth with mouthwash if you wish.  Do not swallow any toothpaste of mouthwash.     ___ 3.  No Alcohol for 24 hours before or after surgery.   ___ 4.  Do Not Smoke or use e-cigarettes For 24 Hours Prior to Your Surgery.                 Do not use any chewable tobacco products for at least 6 hours prior to                 Surgery.  ___  5.  Do not use any recreational drugs (marijuana, cocaine, heroin, ecstasy, MDMA or other)                For at least one week prior to your surgery.  Combination of these drugs with anesthesia                May have life threatening results.  ____  6.  Bring all medications with you on the day of surgery if instructed.   _x___  7.  Notify your doctor if there is any change in your medical condition      (cold, fever, infections).     Do not wear jewelry, make-up, hairpins, clips or nail  polish. Do not wear lotions, powders, or perfumes. You may wear deodorant. Do not shave 48 hours prior to surgery.  Do not bring valuables to the hospital.    [facility] is not responsible for any belongings or valuables.  Contacts, dentures or bridgework may not be worn into surgery. Leave your suitcase in the car. After surgery it may be brought to your room. For patients admitted to the hospital, discharge time is determined by your treatment team.   Patients discharged the day of surgery will not be allowed  to drive home.   Make arrangements for someone to be with you for the first 24 hours of your Same Day Discharge.    Please read over the following fact sheets that you were given:   Incentive spirometer    __x__ Take these medicines the morning of surgery with A SIP OF WATER:    1.acetaminophen (TYLENOL) 500 MG tablet if needed  2.   3.   4.  5.  6.  ____ Fleet Enema (as directed)   __x__ Use CHG Soap (or wipes) as directed  ____ Use Benzoyl Peroxide Gel as instructed  ____ Use inhalers on the day of surgery  ____ Stop metformin 2 days prior to surgery    ____ Take 1/2 of usual insulin dose the night before surgery. No insulin the morning          of surgery.   __x__ Stop Plavix today.     Last dose of Aspirin will be  Sunday 10/10 ____ Stop Anti-inflammatories    ____ Stop supplements until after surgery.    ____ Bring C-Pap to the hospital.    If you have any questions regarding your pre-procedure instructions,  Please call Pre-admit Testing at [phone] [facility]  Stool softener twice a day after surgery

## 2020-03-09 LAB — SARS CORONAVIRUS 2 (TAT 6-24 HRS): SARS Coronavirus 2: NEGATIVE

## 2020-03-12 ENCOUNTER — Ambulatory Visit: Payer: Medicare HMO

## 2020-03-12 ENCOUNTER — Other Ambulatory Visit: Payer: Self-pay

## 2020-03-12 ENCOUNTER — Encounter: Payer: Self-pay | Admitting: Orthopedic Surgery

## 2020-03-12 ENCOUNTER — Ambulatory Visit: Payer: Medicare HMO | Admitting: Anesthesiology

## 2020-03-12 ENCOUNTER — Encounter
Admission: RE | Disposition: A | Discharge status: Home / Self Care | Payer: Self-pay | Source: Home / Self Care | Attending: Orthopedic Surgery

## 2020-03-12 ENCOUNTER — Ambulatory Visit
Admission: RE | Admit: 2020-03-12 | Discharge: 2020-03-12 | Disposition: A | Discharge status: Home / Self Care | Payer: Medicare HMO | Attending: Orthopedic Surgery | Admitting: Orthopedic Surgery

## 2020-03-12 DIAGNOSIS — E119 Type 2 diabetes mellitus without complications: Secondary | ICD-10-CM | POA: Insufficient documentation

## 2020-03-12 DIAGNOSIS — M1712 Unilateral primary osteoarthritis, left knee: Secondary | ICD-10-CM | POA: Diagnosis not present

## 2020-03-12 DIAGNOSIS — Z87891 Personal history of nicotine dependence: Secondary | ICD-10-CM | POA: Insufficient documentation

## 2020-03-12 DIAGNOSIS — G8918 Other acute postprocedural pain: Secondary | ICD-10-CM

## 2020-03-12 DIAGNOSIS — Z955 Presence of coronary angioplasty implant and graft: Secondary | ICD-10-CM | POA: Diagnosis not present

## 2020-03-12 DIAGNOSIS — I252 Old myocardial infarction: Secondary | ICD-10-CM | POA: Insufficient documentation

## 2020-03-12 DIAGNOSIS — Z6836 Body mass index (BMI) 36.0-36.9, adult: Secondary | ICD-10-CM | POA: Insufficient documentation

## 2020-03-12 DIAGNOSIS — Z7984 Long term (current) use of oral hypoglycemic drugs: Secondary | ICD-10-CM | POA: Insufficient documentation

## 2020-03-12 DIAGNOSIS — E7800 Pure hypercholesterolemia, unspecified: Secondary | ICD-10-CM | POA: Diagnosis not present

## 2020-03-12 DIAGNOSIS — Z7982 Long term (current) use of aspirin: Secondary | ICD-10-CM | POA: Diagnosis not present

## 2020-03-12 DIAGNOSIS — Z79899 Other long term (current) drug therapy: Secondary | ICD-10-CM | POA: Diagnosis not present

## 2020-03-12 DIAGNOSIS — Z96652 Presence of left artificial knee joint: Secondary | ICD-10-CM | POA: Diagnosis not present

## 2020-03-12 DIAGNOSIS — I2510 Atherosclerotic heart disease of native coronary artery without angina pectoris: Secondary | ICD-10-CM | POA: Insufficient documentation

## 2020-03-12 DIAGNOSIS — I10 Essential (primary) hypertension: Secondary | ICD-10-CM | POA: Diagnosis not present

## 2020-03-12 DIAGNOSIS — Z471 Aftercare following joint replacement surgery: Secondary | ICD-10-CM | POA: Diagnosis not present

## 2020-03-12 DIAGNOSIS — M6281 Muscle weakness (generalized): Secondary | ICD-10-CM | POA: Diagnosis not present

## 2020-03-12 HISTORY — PX: PARTIAL KNEE ARTHROPLASTY: SHX2174

## 2020-03-12 LAB — ABO/RH: ABO/RH(D): O POS

## 2020-03-12 LAB — GLUCOSE, CAPILLARY
Glucose-Capillary: 180 mg/dL — ABNORMAL HIGH (ref 70–99)
Glucose-Capillary: 171 mg/dL — ABNORMAL HIGH (ref 70–99)
Glucose-Capillary: 228 mg/dL — ABNORMAL HIGH (ref 70–99)

## 2020-03-12 SURGERY — ARTHROPLASTY, KNEE, UNICOMPARTMENTAL
Anesthesia: General | Site: Knee | Laterality: Left

## 2020-03-12 MED ORDER — ONDANSETRON HCL 4 MG/2ML IJ SOLN
INTRAMUSCULAR | Status: DC | PRN
  Administered 2020-03-12 (×2): 4 mg via INTRAVENOUS

## 2020-03-12 MED ORDER — METOCLOPRAMIDE HCL 5 MG/ML IJ SOLN: 5.0000 mg | Freq: Three times a day (TID) | INTRAMUSCULAR | Status: DC | PRN

## 2020-03-12 MED ORDER — DEXAMETHASONE SODIUM PHOSPHATE 10 MG/ML IJ SOLN
INTRAMUSCULAR | Status: DC | PRN
  Administered 2020-03-12: 10 mg via INTRAVENOUS

## 2020-03-12 MED ORDER — PHENOL 1.4 % MT LIQD
1.0000 | OROMUCOSAL | Status: DC | PRN
  Filled 2020-03-12: qty 177

## 2020-03-12 MED ORDER — OXYCODONE HCL 5 MG PO TABS
ORAL_TABLET | ORAL | Status: AC
  Administered 2020-03-12: 5 mg via ORAL
  Filled 2020-03-12: qty 1

## 2020-03-12 MED ORDER — ASPIRIN 81 MG PO TABS: 81.0000 mg | ORAL_TABLET | Freq: Every day | ORAL | Status: DC

## 2020-03-12 MED ORDER — ACETAMINOPHEN 325 MG PO TABS: 325.0000 mg | ORAL_TABLET | Freq: Four times a day (QID) | ORAL | Status: DC | PRN

## 2020-03-12 MED ORDER — NEOMYCIN-POLYMYXIN B GU 40-200000 IR SOLN
Status: AC
  Filled 2020-03-12: qty 20

## 2020-03-12 MED ORDER — FAMOTIDINE 20 MG PO TABS
ORAL_TABLET | ORAL | Status: AC
  Administered 2020-03-12: 20 mg via ORAL
  Filled 2020-03-12: qty 1

## 2020-03-12 MED ORDER — HYDROCODONE-ACETAMINOPHEN 5-325 MG PO TABS: 1.0000 | ORAL_TABLET | Freq: Four times a day (QID) | ORAL | 0 refills | Status: AC | PRN

## 2020-03-12 MED ORDER — SODIUM CHLORIDE 0.9 % IR SOLN
Status: DC | PRN
  Administered 2020-03-12: 200 mL

## 2020-03-12 MED ORDER — CHLORHEXIDINE GLUCONATE 0.12 % MT SOLN
OROMUCOSAL | Status: AC
  Administered 2020-03-12: 15 mL via OROMUCOSAL
  Filled 2020-03-12: qty 15

## 2020-03-12 MED ORDER — ORAL CARE MOUTH RINSE: 15.0000 mL | Freq: Once | OROMUCOSAL | Status: AC

## 2020-03-12 MED ORDER — GLIPIZIDE ER 10 MG PO TB24: 10.0000 mg | ORAL_TABLET | Freq: Every day | ORAL | Status: DC

## 2020-03-12 MED ORDER — LACTATED RINGERS IV BOLUS
500.0000 mL | Freq: Once | INTRAVENOUS | Status: AC
  Administered 2020-03-12: 500 mL via INTRAVENOUS

## 2020-03-12 MED ORDER — PROMETHAZINE HCL 25 MG/ML IJ SOLN: 6.2500 mg | INTRAMUSCULAR | Status: DC | PRN

## 2020-03-12 MED ORDER — SODIUM CHLORIDE (PF) 0.9 % IJ SOLN
INTRAMUSCULAR | Status: AC
  Filled 2020-03-12: qty 50

## 2020-03-12 MED ORDER — CEFAZOLIN SODIUM-DEXTROSE 1-4 GM/50ML-% IV SOLN
INTRAVENOUS | Status: AC
  Filled 2020-03-12: qty 50

## 2020-03-12 MED ORDER — ACETAMINOPHEN 10 MG/ML IV SOLN
INTRAVENOUS | Status: AC
  Filled 2020-03-12: qty 100

## 2020-03-12 MED ORDER — ASPIRIN 81 MG PO CHEW: 81.0000 mg | CHEWABLE_TABLET | Freq: Two times a day (BID) | ORAL | Status: DC

## 2020-03-12 MED ORDER — HYDRALAZINE HCL 20 MG/ML IJ SOLN
INTRAMUSCULAR | Status: DC | PRN
  Administered 2020-03-12 (×2): 5 mg via INTRAVENOUS

## 2020-03-12 MED ORDER — PROPOFOL 500 MG/50ML IV EMUL
INTRAVENOUS | Status: AC
  Filled 2020-03-12: qty 50

## 2020-03-12 MED ORDER — BUPIVACAINE-EPINEPHRINE (PF) 0.25% -1:200000 IJ SOLN
INTRAMUSCULAR | Status: DC | PRN
  Administered 2020-03-12: 30 mL via PERINEURAL

## 2020-03-12 MED ORDER — CLOPIDOGREL BISULFATE 75 MG PO TABS: 75.0000 mg | ORAL_TABLET | Freq: Every day | ORAL | Status: DC

## 2020-03-12 MED ORDER — ONDANSETRON HCL 4 MG PO TABS: 4.0000 mg | ORAL_TABLET | Freq: Four times a day (QID) | ORAL | Status: DC | PRN

## 2020-03-12 MED ORDER — ONDANSETRON HCL 4 MG/2ML IJ SOLN: 4.0000 mg | Freq: Four times a day (QID) | INTRAMUSCULAR | Status: DC | PRN

## 2020-03-12 MED ORDER — FENTANYL CITRATE (PF) 100 MCG/2ML IJ SOLN
INTRAMUSCULAR | Status: DC | PRN
  Administered 2020-03-12 (×2): 50 ug via INTRAVENOUS
  Administered 2020-03-12 (×4): 25 ug via INTRAVENOUS

## 2020-03-12 MED ORDER — SODIUM CHLORIDE 0.9 % IV SOLN: INTRAVENOUS | Status: DC

## 2020-03-12 MED ORDER — FENTANYL CITRATE (PF) 100 MCG/2ML IJ SOLN
INTRAMUSCULAR | Status: AC
  Filled 2020-03-12: qty 2

## 2020-03-12 MED ORDER — SUGAMMADEX SODIUM 200 MG/2ML IV SOLN
INTRAVENOUS | Status: DC | PRN
  Administered 2020-03-12: 170 mg via INTRAVENOUS

## 2020-03-12 MED ORDER — DOCUSATE SODIUM 100 MG PO CAPS: 100.0000 mg | ORAL_CAPSULE | Freq: Two times a day (BID) | ORAL | Status: DC

## 2020-03-12 MED ORDER — MORPHINE SULFATE (PF) 4 MG/ML IV SOLN
INTRAVENOUS | Status: AC
  Filled 2020-03-12: qty 1

## 2020-03-12 MED ORDER — SODIUM CHLORIDE FLUSH 0.9 % IV SOLN
INTRAVENOUS | Status: AC
  Filled 2020-03-12: qty 10

## 2020-03-12 MED ORDER — FENTANYL CITRATE (PF) 100 MCG/2ML IJ SOLN: 25.0000 ug | INTRAMUSCULAR | Status: DC | PRN

## 2020-03-12 MED ORDER — OXYCODONE HCL 5 MG/5ML PO SOLN: 5.0000 mg | Freq: Once | ORAL | Status: AC | PRN

## 2020-03-12 MED ORDER — INSULIN ASPART 100 UNIT/ML ~~LOC~~ SOLN: 0.0000 [IU] | Freq: Three times a day (TID) | SUBCUTANEOUS | Status: DC

## 2020-03-12 MED ORDER — CEFAZOLIN SODIUM-DEXTROSE 1-4 GM/50ML-% IV SOLN
1.0000 g | Freq: Four times a day (QID) | INTRAVENOUS | Status: DC
  Administered 2020-03-12: 1 g via INTRAVENOUS

## 2020-03-12 MED ORDER — HYDROCHLOROTHIAZIDE 25 MG PO TABS: 25.0000 mg | ORAL_TABLET | Freq: Every day | ORAL | Status: DC

## 2020-03-12 MED ORDER — METFORMIN HCL 500 MG PO TABS: 1000.0000 mg | ORAL_TABLET | Freq: Two times a day (BID) | ORAL | Status: DC

## 2020-03-12 MED ORDER — MORPHINE SULFATE (PF) 2 MG/ML IV SOLN: 0.5000 mg | INTRAVENOUS | Status: DC | PRN

## 2020-03-12 MED ORDER — HYDROCODONE-ACETAMINOPHEN 5-325 MG PO TABS: 1.0000 | ORAL_TABLET | ORAL | Status: DC | PRN

## 2020-03-12 MED ORDER — METOCLOPRAMIDE HCL 10 MG PO TABS: 5.0000 mg | ORAL_TABLET | Freq: Three times a day (TID) | ORAL | Status: DC | PRN

## 2020-03-12 MED ORDER — LIDOCAINE HCL (CARDIAC) PF 100 MG/5ML IV SOSY
PREFILLED_SYRINGE | INTRAVENOUS | Status: DC | PRN
  Administered 2020-03-12: 100 mg via INTRAVENOUS

## 2020-03-12 MED ORDER — LOSARTAN POTASSIUM 50 MG PO TABS: 100.0000 mg | ORAL_TABLET | Freq: Every day | ORAL | Status: DC

## 2020-03-12 MED ORDER — MENTHOL 3 MG MT LOZG
1.0000 | LOZENGE | OROMUCOSAL | Status: DC | PRN
  Filled 2020-03-12: qty 9

## 2020-03-12 MED ORDER — HYDROCODONE-ACETAMINOPHEN 7.5-325 MG PO TABS
1.0000 | ORAL_TABLET | ORAL | Status: DC | PRN
  Filled 2020-03-12: qty 2

## 2020-03-12 MED ORDER — ACETAMINOPHEN 10 MG/ML IV SOLN
INTRAVENOUS | Status: DC | PRN
  Administered 2020-03-12: 1000 mg via INTRAVENOUS

## 2020-03-12 MED ORDER — MORPHINE SULFATE (PF) 10 MG/ML IV SOLN
INTRAVENOUS | Status: DC | PRN
Start: 2020-03-12 — End: 2020-03-12
  Administered 2020-03-12: 4 mg

## 2020-03-12 MED ORDER — PROPOFOL 10 MG/ML IV BOLUS
INTRAVENOUS | Status: DC | PRN
  Administered 2020-03-12: 120 mg via INTRAVENOUS
  Administered 2020-03-12: 30 mg via INTRAVENOUS

## 2020-03-12 MED ORDER — ACETAMINOPHEN 500 MG PO TABS: 1000.0000 mg | ORAL_TABLET | Freq: Four times a day (QID) | ORAL | Status: DC | PRN

## 2020-03-12 MED ORDER — OXYCODONE HCL 5 MG PO TABS: 5.0000 mg | ORAL_TABLET | Freq: Once | ORAL | Status: AC | PRN

## 2020-03-12 MED ORDER — LACTATED RINGERS IV BOLUS
250.0000 mL | Freq: Once | INTRAVENOUS | Status: AC
  Administered 2020-03-12: 250 mL via INTRAVENOUS

## 2020-03-12 MED ORDER — PROMETHAZINE HCL 25 MG/ML IJ SOLN
INTRAMUSCULAR | Status: AC
  Administered 2020-03-12: 6.25 mg via INTRAVENOUS
  Filled 2020-03-12: qty 1

## 2020-03-12 MED ORDER — CEFAZOLIN SODIUM-DEXTROSE 2-4 GM/100ML-% IV SOLN
INTRAVENOUS | Status: AC
  Filled 2020-03-12: qty 100

## 2020-03-12 MED ORDER — ATORVASTATIN CALCIUM 20 MG PO TABS: 40.0000 mg | ORAL_TABLET | Freq: Every day | ORAL | Status: DC

## 2020-03-12 MED ORDER — CHLORHEXIDINE GLUCONATE 0.12 % MT SOLN: 15.0000 mL | Freq: Once | OROMUCOSAL | Status: AC

## 2020-03-12 MED ORDER — BUPIVACAINE LIPOSOME 1.3 % IJ SUSP
INTRAMUSCULAR | Status: AC
  Filled 2020-03-12: qty 20

## 2020-03-12 MED ORDER — SODIUM CHLORIDE 0.9 % IV SOLN
INTRAVENOUS | Status: DC | PRN
  Administered 2020-03-12: 48 mL

## 2020-03-12 MED ORDER — FAMOTIDINE 20 MG PO TABS: 20.0000 mg | ORAL_TABLET | Freq: Once | ORAL | Status: AC

## 2020-03-12 MED ORDER — CEFAZOLIN SODIUM-DEXTROSE 2-4 GM/100ML-% IV SOLN
2.0000 g | INTRAVENOUS | Status: AC
  Administered 2020-03-12: 2 g via INTRAVENOUS

## 2020-03-12 MED ORDER — BUPIVACAINE-EPINEPHRINE (PF) 0.25% -1:200000 IJ SOLN
INTRAMUSCULAR | Status: AC
  Filled 2020-03-12: qty 30

## 2020-03-12 MED ORDER — TRAMADOL HCL 50 MG PO TABS: 50.0000 mg | ORAL_TABLET | Freq: Four times a day (QID) | ORAL | Status: DC

## 2020-03-12 MED ORDER — MEPERIDINE HCL 50 MG/ML IJ SOLN: 6.2500 mg | INTRAMUSCULAR | Status: DC | PRN

## 2020-03-12 MED ORDER — ROCURONIUM BROMIDE 100 MG/10ML IV SOLN
INTRAVENOUS | Status: DC | PRN
  Administered 2020-03-12: 50 mg via INTRAVENOUS

## 2020-03-12 SURGICAL SUPPLY — 92 items
APL PRP STRL LF DISP 70% ISPRP (MISCELLANEOUS) ×2
BEARING MENISCAL TIBIAL 5 SM L (Knees) ×3 IMPLANT
BLADE SAW STABLE CUT 109 (BLADE) ×3 IMPLANT
BLADE SURG 15 STRL LF DISP TIS (BLADE) IMPLANT
BLADE SURG 15 STRL SS (BLADE)
BLADE SURG SZ10 CARB STEEL (BLADE) ×6 IMPLANT
BNDG COHESIVE 6X5 TAN STRL LF (GAUZE/BANDAGES/DRESSINGS) ×3 IMPLANT
BNDG ELASTIC 6X5.8 VLCR STR LF (GAUZE/BANDAGES/DRESSINGS) ×3 IMPLANT
BONE CEMENT GENTAMICIN (Cement) ×3 IMPLANT
BOWL CEMENT MIX W/ADAPTER (MISCELLANEOUS) ×3 IMPLANT
BRNG TIB SM 5 PHS 3 LT MEN (Knees) ×1 IMPLANT
CANISTER PREVENA 45 (CANNISTER) ×6 IMPLANT
CANISTER SUCT 1200ML W/VALVE (MISCELLANEOUS) ×3 IMPLANT
CANISTER SUCT 3000ML PPV (MISCELLANEOUS) ×3 IMPLANT
CEMENT BONE GENTAMICIN 40 (Cement) ×1 IMPLANT
CHLORAPREP W/TINT 26 (MISCELLANEOUS) ×6 IMPLANT
CNTNR SPEC 2.5X3XGRAD LEK (MISCELLANEOUS) ×1
COMPONENT TIB MDL OXFRD LT SZA (Joint) ×1 IMPLANT
CONNECTOR PREVENA (MISCELLANEOUS) IMPLANT
CONT SPEC 4OZ STER OR WHT (MISCELLANEOUS) ×2
CONT SPEC 4OZ STRL OR WHT (MISCELLANEOUS) ×1
CONTAINER SPEC 2.5X3XGRAD LEK (MISCELLANEOUS) ×1 IMPLANT
COOLER POLAR GLACIER W/PUMP (MISCELLANEOUS) ×3 IMPLANT
COVER BACK TABLE REUSABLE LG (DRAPES) ×3 IMPLANT
COVER WAND RF STERILE (DRAPES) ×3 IMPLANT
CUFF TOURN SGL QUICK 24 (TOURNIQUET CUFF)
CUFF TOURN SGL QUICK 30 (TOURNIQUET CUFF)
CUFF TRNQT CYL 24X4X16.5-23 (TOURNIQUET CUFF) IMPLANT
CUFF TRNQT CYL 30X4X21-28X (TOURNIQUET CUFF) IMPLANT
DRAPE 3/4 80X56 (DRAPES) ×3 IMPLANT
DRAPE INCISE IOBAN 66X45 STRL (DRAPES) ×3 IMPLANT
DRAPE POUCH INSTRU U-SHP 10X18 (DRAPES) ×3 IMPLANT
DRAPE SPLIT 6X30 W/TAPE (DRAPES) ×3 IMPLANT
ELECT CAUTERY BLADE 6.4 (BLADE) ×3 IMPLANT
ELECT REM PT RETURN 9FT ADLT (ELECTROSURGICAL) ×3
ELECTRODE REM PT RTRN 9FT ADLT (ELECTROSURGICAL) ×1 IMPLANT
GAUZE 4X4 16PLY RFD (DISPOSABLE) ×3 IMPLANT
GAUZE SPONGE 4X4 12PLY STRL (GAUZE/BANDAGES/DRESSINGS) ×3 IMPLANT
GAUZE XEROFORM 1X8 LF (GAUZE/BANDAGES/DRESSINGS) ×3 IMPLANT
GLOVE BIOGEL PI IND STRL 9 (GLOVE) ×1 IMPLANT
GLOVE BIOGEL PI INDICATOR 9 (GLOVE) ×2
GLOVE INDICATOR 8.0 STRL GRN (GLOVE) ×3 IMPLANT
GLOVE SURG ORTHO 8.0 STRL STRW (GLOVE) ×3 IMPLANT
GLOVE SURG SYN 9.0  PF PI (GLOVE) ×2
GLOVE SURG SYN 9.0 PF PI (GLOVE) ×1 IMPLANT
GOWN SRG 2XL LVL 4 RGLN SLV (GOWNS) ×1 IMPLANT
GOWN STRL NON-REIN 2XL LVL4 (GOWNS) ×3
GOWN STRL REUS W/ TWL LRG LVL3 (GOWN DISPOSABLE) ×1 IMPLANT
GOWN STRL REUS W/ TWL XL LVL3 (GOWN DISPOSABLE) ×1 IMPLANT
GOWN STRL REUS W/TWL LRG LVL3 (GOWN DISPOSABLE) ×3
GOWN STRL REUS W/TWL XL LVL3 (GOWN DISPOSABLE) ×3
GRADUATE 1200CC STRL 31836 (MISCELLANEOUS) ×3 IMPLANT
HANDLE YANKAUER SUCT BULB TIP (MISCELLANEOUS) ×3 IMPLANT
HOLDER FOLEY CATH W/STRAP (MISCELLANEOUS) ×3 IMPLANT
HOOD PEEL AWAY FLYTE STAYCOOL (MISCELLANEOUS) ×6 IMPLANT
KIT PREVENA INCISION MGT 13 (CANNISTER) ×6 IMPLANT
KIT TURNOVER KIT A (KITS) ×3 IMPLANT
MAT ABSORB  FLUID 56X50 GRAY (MISCELLANEOUS) ×2
MAT ABSORB FLUID 56X50 GRAY (MISCELLANEOUS) ×1 IMPLANT
NDL SAFETY ECLIPSE 18X1.5 (NEEDLE) ×1 IMPLANT
NEEDLE HYPO 18GX1.5 SHARP (NEEDLE) ×3
NEEDLE HYPO 21X1.5 SAFETY (NEEDLE) ×3 IMPLANT
NEEDLE SPNL 18GX3.5 QUINCKE PK (NEEDLE) ×3 IMPLANT
NEEDLE SPNL 20GX3.5 QUINCKE YW (NEEDLE) ×3 IMPLANT
NS IRRIG 1000ML POUR BTL (IV SOLUTION) ×3 IMPLANT
PACK ARTHROSCOPY KNEE (MISCELLANEOUS) ×3 IMPLANT
PACK BLADE SAW RECIP 70 3 PT (BLADE) ×3 IMPLANT
PAD WRAPON POLAR KNEE (MISCELLANEOUS) ×1 IMPLANT
PEG FEMORAL PEGGED STRL SM (Knees) ×3 IMPLANT
PENCIL ELECTRO HAND CTR (MISCELLANEOUS) ×3 IMPLANT
PULSAVAC PLUS IRRIG FAN TIP (DISPOSABLE) ×3
SOL .9 NS 3000ML IRR  AL (IV SOLUTION) ×2
SOL .9 NS 3000ML IRR AL (IV SOLUTION) ×1
SOL .9 NS 3000ML IRR UROMATIC (IV SOLUTION) ×1 IMPLANT
SPONGE LAP 18X18 RF (DISPOSABLE) ×6 IMPLANT
STAPLER SKIN PROX 35W (STAPLE) ×3 IMPLANT
SUCTION FRAZIER HANDLE 10FR (MISCELLANEOUS) ×2
SUCTION TUBE FRAZIER 10FR DISP (MISCELLANEOUS) ×1 IMPLANT
SUT DVC 2 QUILL PDO  T11 36X36 (SUTURE) ×2
SUT DVC 2 QUILL PDO T11 36X36 (SUTURE) ×1 IMPLANT
SUT V-LOC 90 ABS DVC 3-0 CL (SUTURE) ×3 IMPLANT
SYR 20ML LL LF (SYRINGE) ×3 IMPLANT
SYR 50ML LL SCALE MARK (SYRINGE) ×6 IMPLANT
SYR BULB IRRIG 60ML STRL (SYRINGE) ×3 IMPLANT
TIBIA MEDIAL OXFORD LEFT SZ A (Joint) ×3 IMPLANT
TIP FAN IRRIG PULSAVAC PLUS (DISPOSABLE) ×1 IMPLANT
TOWEL OR 17X26 4PK STRL BLUE (TOWEL DISPOSABLE) ×3 IMPLANT
TOWER CARTRIDGE SMART MIX (DISPOSABLE) ×3 IMPLANT
TRAY FOLEY MTR SLVR 16FR STAT (SET/KITS/TRAYS/PACK) ×3 IMPLANT
TUBING CONNECTING 10 (TUBING) ×2 IMPLANT
TUBING CONNECTING 10' (TUBING) ×1
WRAPON POLAR PAD KNEE (MISCELLANEOUS) ×3

## 2020-03-12 NOTE — OR Nursing (Signed)
Pt advises she has bedside commode at home but not rolling walker.  Per Young Berry, SW, she will send one up for patient (1145 am).

## 2020-03-12 NOTE — Anesthesia Procedure Notes (Signed)
Procedure Name: Intubation Date/Time: 03/12/2020 7:40 AM Performed by: Nelda Marseille, CRNA Pre-anesthesia Checklist: Patient identified, Patient being monitored, Timeout performed, Emergency Drugs available and Suction available Patient Re-evaluated:Patient Re-evaluated prior to induction Oxygen Delivery Method: Circle system utilized Preoxygenation: Pre-oxygenation with 100% oxygen Induction Type: IV induction Ventilation: Mask ventilation without difficulty Laryngoscope Size: Mac, 3 and McGraph Grade View: Grade I Tube type: Oral Tube size: 7.0 mm Number of attempts: 1 Airway Equipment and Method: Stylet Placement Confirmation: ETT inserted through vocal cords under direct vision,  positive ETCO2 and breath sounds checked- equal and bilateral Secured at: 21 cm Tube secured with: Tape Dental Injury: Teeth and Oropharynx as per pre-operative assessment

## 2020-03-12 NOTE — OR Nursing (Signed)
Rolling walker delievered to pt in postop.  Discharge pending PT eval.

## 2020-03-12 NOTE — Discharge Instructions (Addendum)
Have home health remove Prevena wound VAC in 1 week.  You should not have to touch this over the next week.  Resume your Plavix tomorrow morning and your aspirin.  Okay to shower with waterproof bandage in place.  Call office if having problems.  Pain medicine as directed.  Home health should contact you by tomorrow afternoon if they have not call our office.  Partial Knee Replacement, Care After This sheet gives you information about how to care for yourself after your procedure. Your health care provider may also give you more specific instructions. If you have problems or questions, contact your health care provider. What can I expect after the procedure? After the procedure, it is common to have:  Pain. This can be controlled with pain medicine.  Soreness.  Stiffness.  Swelling.  Bruising. Follow these instructions at home: Medicines  Take over-the-counter and prescription medicines only as told by your health care provider.  If you were prescribed a blood thinner (anticoagulant), take it as told by your health care provider.  Ask your health care provider if the medicine prescribed to you: ? Requires you to avoid driving or using heavy machinery. ? Can cause constipation. You may need to take these actions to prevent or treat constipation:  Drink enough fluid to keep your urine pale yellow.  Take over-the-counter or prescription medicines.  Eat foods that are high in fiber, such as beans, whole grains, and fresh fruits and vegetables.  Limit foods that are high in fat and processed sugars, such as fried or sweet foods. Bathing  Do not take baths, swim, or use a hot tub until your health care provider approves. Ask your health care provider if you may take showers.  Keep your bandage (dressing) dry until your health care provider says it can be removed. If the dressing is not waterproof, cover it with a waterproof covering when you take a shower. Incision  care   Follow instructions from your health care provider about how to take care of your incision. Make sure you: ? Wash your hands with soap and water before and after you change your dressing. If soap and water are not available, use hand sanitizer. ? Change your dressing as told by your health care provider. ? Leave stitches (sutures), skin glue, or adhesive strips in place. These skin closures may need to stay in place for 2 weeks or longer. If adhesive strip edges start to loosen and curl up, you may trim the loose edges. Do not remove adhesive strips completely unless your health care provider tells you to do that.  Check your incision area every day for signs of infection. Check for: ? More redness, swelling, or pain. ? More fluid or blood. ? Warmth. ? Pus or a bad smell. Managing pain, stiffness, and swelling      If directed, put ice on the affected area. ? Put ice in a plastic bag or use the icing device (cold flow pad or cryocuff) that you were given. Follow instructions from your health care provider about how to use the icing device. ? Place a towel between your skin and the bag or between your skin and the icing device. ? Leave the ice on for 20 minutes, 2-3 times a day.  If directed, apply heat to the affected area before you exercise. Use the heat source that your health care provider recommends, such as a moist heat pack or a heating pad. ? Place a towel between your skin and  the heat source. ? Leave the heat on for 20-30 minutes. ? Remove the heat if your skin turns bright red. This is especially important if you are unable to feel pain, heat, or cold. You may have a greater risk of getting burned.  Move your toes often to reduce stiffness and swelling.  Raise (elevate) your knee above the level of your heart while you are sitting or lying down. ? Use several pillows to keep your leg straight. ? Do not put a pillow just under the knee. If the knee is bent for a long  time, this may lead to stiffness.  Wear elastic knee support as told by your health care provider. Activity   Use your walker, cane, or crutches to walk. You can put some weight on your knee. You should be able to walk without assistance after several days or a few weeks.  Do your knee exercises as instructed by your physical therapist.  Ask your health care provider when it is safe to drive.  Return to your normal activities as told by your health care provider. Ask your health care provider what activities are safe for you. Returning to all your usual activities may take about 6 weeks. General instructions  Wear compression stockings as told by your health care provider.  Tell your health care provider if you plan to have dental work. Also, tell your dentist about your joint replacement. ? Ask your health care provider if there are any special instructions you need to follow before having dental care and routine cleanings.  Do not use any products that contain nicotine or tobacco, such as cigarettes, e-cigarettes, and chewing tobacco. If you need help quitting, ask your health care provider.  Keep all follow-up visits with your health care provider and your physical therapist. This is important. Physical therapy may continue for several months after surgery. Contact a health care provider if you:  Have chills or a fever.  Have pain that is not controlled by your pain medicine.  Are unable to put weight on your knee.  Have any signs of infection when you check your incision. Get help right away if you:  Develop a warm, tender, red, or swollen area in your leg.  Have chest pain or trouble breathing. Summary  After the procedure, it is common to have pain, soreness, and stiffness.  Follow instructions from your health care provider about how to take care of your incision. Check your incision area every day for signs of infection.  Use your walker, cane, or crutches to walk  as told by your health care provider. You may have to do this for the first few days or weeks. You can put some weight on your knee as told by your health care provider.  Return to your normal activities as told by your health care provider. Ask your health care provider what activities are safe for you. Returning to all your usual activities may take about 6 weeks.  Call your health care provider if your pain medicine is not helping or if you have any signs of infection. This information is not intended to replace advice given to you by your health care provider. Make sure you discuss any questions you have with your health care provider. Document Revised: 06/08/2018 Document Reviewed: 06/08/2018 Elsevier Patient Education  2020 Elsevier Inc.  AMBULATORY SURGERY  DISCHARGE INSTRUCTIONS   1) The drugs that you were given will stay in your system until tomorrow so for the next 24  hours you should not:  A) Drive an automobile B) Make any legal decisions C) Drink any alcoholic beverage   2) You may resume regular meals tomorrow.  Today it is better to start with liquids and gradually work up to solid foods.  You may eat anything you prefer, but it is better to start with liquids, then soup and crackers, and gradually work up to solid foods.   3) Please notify your doctor immediately if you have any unusual bleeding, trouble breathing, redness and pain at the surgery site, drainage, fever, or pain not relieved by medication.    4) Additional Instructions:        Please contact your physician with any problems or Same Day Surgery at [phone], Monday through Friday 6 am to 4 pm, or [place] at [facility] number at [phone].   Bupivacaine Liposomal Suspension for Injection What is this medicine? BUPIVACAINE LIPOSOMAL (bue PIV a kane LIP oh som al) is an anesthetic. It causes loss of feeling in the skin or other tissues. It is used to prevent and to treat pain from  some procedures. This medicine may be used for other purposes; ask your health care provider or pharmacist if you have questions. COMMON BRAND NAME(S): EXPAREL What should I tell my health care provider before I take this medicine? They need to know if you have any of these conditions:  G6PD deficiency  heart disease  kidney disease  liver disease  low blood pressure  lung or breathing disease, like asthma  an unusual or allergic reaction to bupivacaine, other medicines, foods, dyes, or preservatives  pregnant or trying to get pregnant  breast-feeding How should I use this medicine? This medicine is for injection into the affected area. It is given by a health care professional in a hospital or clinic setting. Talk to your pediatrician regarding the use of this medicine in children. Special care may be needed. Overdosage: If you think you have taken too much of this medicine contact a poison control center or emergency room at once. NOTE: This medicine is only for you. Do not share this medicine with others. What if I miss a dose? This does not apply. What may interact with this medicine? This medicine may interact with the following medications:  acetaminophen  certain antibiotics like dapsone, nitrofurantoin, aminosalicylic acid, sulfonamides  certain medicines for seizures like phenobarbital, phenytoin, valproic acid  chloroquine  cyclophosphamide  flutamide  hydroxyurea  ifosfamide  metoclopramide  nitric oxide  nitroglycerin  nitroprusside  nitrous oxide  other local anesthetics like lidocaine, pramoxine, tetracaine  primaquine  quinine  rasburicase  sulfasalazine This list may not describe all possible interactions. Give your health care provider a list of all the medicines, herbs, non-prescription drugs, or dietary supplements you use. Also tell them if you smoke, drink alcohol, or use illegal drugs. Some items may interact with your  medicine. What should I watch for while using this medicine? Your condition will be monitored carefully while you are receiving this medicine. Be careful to avoid injury while the area is numb, and you are not aware of pain. What side effects may I notice from receiving this medicine? Side effects that you should report to your doctor or health care professional as soon as possible:  allergic reactions like skin rash, itching or hives, swelling of the face, lips, or tongue  seizures  signs and symptoms of a dangerous change in heartbeat or heart rhythm like chest pain; dizziness; fast, irregular heartbeat;  palpitations; feeling faint or lightheaded; falls; breathing problems  signs and symptoms of methemoglobinemia such as pale, gray, or blue colored skin; headache; fast heartbeat; shortness of breath; feeling faint or lightheaded, falls; tiredness Side effects that usually do not require medical attention (report to your doctor or health care professional if they continue or are bothersome):  anxious  back pain  changes in taste  changes in vision  constipation  dizziness  fever  nausea, vomiting This list may not describe all possible side effects. Call your doctor for medical advice about side effects. You may report side effects to FDA at 1-800-FDA-1088. Where should I keep my medicine? This drug is given in a hospital or clinic and will not be stored at home. NOTE: This sheet is a summary. It may not cover all possible information. If you have questions about this medicine, talk to your doctor, pharmacist, or health care provider.  2020 Elsevier/Gold Standard (2019-02-28 10:48:23)

## 2020-03-12 NOTE — OR Nursing (Signed)
PT eval complete; wound vac beeping.  Concha Norway, RN from OR in to change container, states "it's kind of full"; extra cartridge given to take home.  Concha Norway, RN instructed pt/spouse how to change.

## 2020-03-12 NOTE — OR Nursing (Signed)
Dr. Rosita Kea in to see pt in postop @ 11:07

## 2020-03-12 NOTE — TOC Progression Note (Signed)
Transition of Care [facility]) - Progression Note    Patient Details  Name: Wendy Odom MRN: [id number] Date of Birth: [birth year]  Transition of Care [facility]) CM/SW Contact  [name] Phone Number: [phone] 03/12/2020, 12:07 PM  Clinical Narrative:    RN called with request for rolling walker.  CSW reached out to [employer] for delivery.  [name] acknowledged request.      Expected Discharge Plan and Services           Expected Discharge Date: 03/12/20                                     Social Determinants of Health (SDOH) Interventions    Readmission Risk Interventions No flowsheet data found.

## 2020-03-12 NOTE — OR Nursing (Signed)
Pt returned to the ER, prevena full and unable to change on cannister d/t connector issues. This RN changed cannister and also showed patient how to change it out again if needed with the correct supplies. Pt left ER lobby with prevena operating wnl.

## 2020-03-12 NOTE — H&P (Signed)
Chief Complaint  Patient presents with  . Follow-up  MRI results Lt Knee    History of the Present Illness: Wendy Odom is a 79 y.o. female here today.   The patient presents for discussion of left knee surgery, as she has been experiencing left medial knee pain. Reviewed previous x-rays obtained on 10/27/2019 by Rosalia Hammers, DO which revealed some moderate arthritis, no large posterior osteophytes. A recent MRI was obtained on 01/25/2020, which demonstrates a complex tear of the posterior horn of the medial meniscus, as well as lateral horizontal tear of the anterior meniscus. On the lateral side, the collateral is intact. There is a large osteochondral defect of the medial femoral condyle. The MRI showed much worse osteoarthritis than plain x-ray.  The patient states her left knee has been painful for 3 months. Prior to this, she had no pain or swelling. The patient feels as though something is moving around inside her knee. The patient states she was walking consistently prior to the onset of her pain. She was given a brace to wear, but it is ill fitting. She states she would like to proceed with surgical treatment of her left knee   The patient is a diabetic. Her last A1c was 8.0. Her primary care physician is Derinda Late, MD.  The patient retired 2 years ago. 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   I have reviewed past medical, surgical, social and family history, and allergies as documented in the EMR.  Past Medical History: Past Medical History:  Diagnosis Date  . Coronary artery disease  . Diabetes mellitus type 2, uncomplicated (CMS-HCC)  . History of kidney stones  . History of tobacco abuse  Quit 2010.  Marland Kitchen Hyperlipidemia  . Hypertension  . Internal hemorrhoids without complication 07/09/32   Past Surgical History: Past Surgical History:   Procedure Laterality Date  . CHOLECYSTECTOMY 1990  . Colonoscopy 2008 10/07/2006  internal, non bleeding smal hemorroids  . Overlapping XIENCE stents proximal mid-LAD coronary arteries 10/16/10   Past Family History: Family History  Problem Relation Age of Onset  . Bone cancer Mother  . Stroke Father  Multiple CVAs  . Stomach cancer Father  . High blood pressure (Hypertension) Father  . Kidney disease Son  . Aneurysm Sister  . High blood pressure (Hypertension) Sister   Medications: Current Outpatient Medications Ordered in Epic  Medication Sig Dispense Refill  . aspirin 81 MG EC tablet Take 81 mg by mouth once daily.  Marland Kitchen atorvastatin (LIPITOR) 40 MG tablet Take 1 tablet (40 mg total) by mouth once daily 90 tablet 3  . blood glucose control, normal Soln Use 1 each as directed 3 each 0  . clopidogreL (PLAVIX) 75 mg tablet TAKE 1 TABLET EVERY DAY 90 tablet 1  . glipiZIDE (GLUCOTROL XL) 10 MG XL tablet TAKE 1 TABLET EVERY DAY 90 tablet 1  . hydroCHLOROthiazide (HYDRODIURIL) 25 MG tablet TAKE 1 TABLET EVERY DAY 90 tablet 1  . losartan (COZAAR) 100 MG tablet TAKE 1 TABLET EVERY DAY 90 tablet 1  . metFORMIN (GLUCOPHAGE-XR) 500 MG XR tablet Take 2 tablets (1,000 mg total) by mouth 2 (two) times daily 360 tablet 1  . nitroGLYcerin (NITROSTAT) 0.4 MG SL tablet Take 1 tablet under tongue as directed. May take up to 3 doses.  Marland Kitchen SITagliptin (JANUVIA) 100 MG tablet Take 1 tablet (100  mg total) by mouth once daily 90 tablet 1  . triamcinolone 0.1 % cream Apply topically 2 (two) times daily 80 g 0  . ACCU-CHEK AVIVA PLUS TEST STRP test strip CHECK BLOOD SUGAR EVERY DAY AS DIRECTED (Patient not taking: Reported on 03/04/2020) 100 strip 3  . ACCU-CHEK SOFTCLIX LANCETS lancets USE AS INSTRUCTED ONE TIME DAILY (Patient not taking: Reported on 03/04/2020) 100 each 3  . alcohol swabs (BD ALCOHOL SWABS) PadM Apply 1 each topically once daily (Patient not taking: Reported on 03/04/2020 ) 100 Swab 3  . blood  glucose meter kit Use as directed 1 each 0   No current Epic-ordered facility-administered medications on file.   Allergies: Allergies  Allergen Reactions  . Accupril [Quinapril] Unknown  . Glipizide Other (See Comments)  Makes patient feel "Shakey" and not herself    Body mass index is 36.6 kg/m.  Review of Systems: A comprehensive 14 point ROS was performed, reviewed, and the pertinent orthopaedic findings are documented in the HPI.  Vitals:  03/04/20 0928  BP: 166/82    General Physical Examination:   General/Constitutional: No apparent distress: well-nourished and well developed. Eyes: Pupils equal, round with synchronous movement. Lungs: Clear to auscultation HEENT: She is edentulous with full upper and lower dentures. Vascular: She has 1+ edema in the lower extremities with trace palpable pulses. Cardiac: Heart rate and rhythm is regular. Integumentary: No impressive skin lesions present, except as noted in detailed exam. Neuro/Psych: Normal mood and affect, oriented to person, place and time.  Musculoskeletal Examination:   On exam, the patient has a slight varus deformity that is passively correctable. Skin is intact. There is a large effusion to the left knee. Left knee range of motion is 5-110 degrees.   Radiographs:  No new imaging studies were obtained or reviewed today.  Assessment: ICD-10-CM  1. Primary osteoarthritis of left knee M17.12  2. Severe obesity (BMI 35.0-39.9) with comorbidity (CMS-HCC) E66.01   Plan:  The patient has clinical findings of severe left knee medial compartment osteoarthritis with osteochondral defect.  We discussed the patient's prior x-ray and MRI findings. We discussed partial knee arthroplasty versus total knee arthroplasty. My recommendation is left partial knee arthroplasty as an outpatient. The patient would like to proceed with surgical treatment of her left knee.   She will be scheduled for surgery in the near  future.  Surgical Risks: The nature of the condition and the proposed procedure has been reviewed in detail with the patient. Surgical versus non-surgical options and prognosis for recovery have been reviewed and the inherent risks and benefits of each have been discussed including the risks of infection, bleeding, injury to nerves/blood vessels/tendons, incomplete relief of symptoms, persisting pain and/or stiffness, loss of function, complex regional pain syndrome, failure of the procedure, as appropriate.  Teeth: Full upper and lower dentures.  I, Dawn Bonne Dolores, am acting as scribe for Audrie Gallus, MD.   Attestation: I, Dawn Royse, am documenting for [facility], MD utilizing [place].     Electronically signed by Lauris Poag, MD at 03/04/2020 7:01 PM EDT  Back to top of Progress Notes Lauris Poag, MD - 03/04/2020 9:15 AM EDT  Reviewed paper H+P. No changes noted.

## 2020-03-12 NOTE — OR Nursing (Signed)
PT in for evaluation @ 13:44.

## 2020-03-12 NOTE — Op Note (Signed)
03/12/2020  9:21 AM  PATIENT:  Wendy Odom  79 y.o. female  PRE-OPERATIVE DIAGNOSIS:  Primary osteoarthritis of left knee M17.12  POST-OPERATIVE DIAGNOSIS:  Primary osteoarthritis of left knee M17.12  PROCEDURE:  Procedure(s): Left partial knee replacement (Left)  SURGEON: Leitha Schuller, MD  ASSISTANTS: Cranston Neighbor, PA-C  ANESTHESIA:   general  EBL:  Total I/O In: 200 [IV Piggyback:200] Out: -   BLOOD ADMINISTERED:none  DRAINS: Incisional wound VAC   LOCAL MEDICATIONS USED:  MARCAINE    and OTHER Exparel and morphine  SPECIMEN:  No Specimen  DISPOSITION OF SPECIMEN:  N/A  COUNTS:  YES  TOURNIQUET: 67 minutes at 300 mmHg  IMPLANTS: [employer] [place] medial compartment system left size a tibia, small left femur, 5 mm insert  DICTATION: .Dragon Dictation patient brought the operating room and after adequate anesthesia was obtained patient was placed on the operative table with the left leg in the leg holder.  After prepping and draping in the usual sterile Odom with a tourniquet to the upper thigh appropriate patient identification and timeout procedures were completed.  Tourniquet was raised at the start of the case and a medial parapatellar skin and arthrotomy were performed showing extensive fluid within the knee and loose cartilage around the distal femoral condyle with essentially a crater present with all the cartilage gone.  The tibia also had a significant wear centrally and anteriorly.  ACL was intact.  The extra medullary tibial alignment guide was placed with the femoral spoon placed and a 4 mm resection on the tibia performed.  Placing the trial and using intramedullary femoral guide the drill holes for the distal femur were made and the 0 spigot followed by reaming carried out with an additional 3 mm after trial and checking flexion extension gaps.  The additional 3 mm were reamed off the distal femur posterior cut made and the excitement tear meniscus  excised at this point.  With trials the 4 mm gap gauge fit well in flexion and extension the tibial baseplate was pinned into position and the toothbrush saw utilized this removed and the anterior reamer on the femoral side to prevent impingement anteriorly.  At this point the above local was injected all around the knee.  The bony surfaces were thoroughly irrigated and dried.  Tibia component was cemented into place first with excess cement removed followed by placement of the femoral component again after impaction removing excess cement.  With the knee held in about 15 degrees of extension and a 5 mm feeler gauge placed in varus stress the cement was allowed to set.  The wound was then thoroughly irrigated and a 4 and then 5 mm trial placed the 5 mm gave better stability and was chosen for the final component.  When the final 5 mm plastic component had been placed in position and through range of motion there is no impingement the tourniquet was let down the knee thoroughly irrigated and then the arthrotomy repaired using a heavy Quill followed by 3 OV lock subcutaneously.  Skin staples and incisional wound VAC applied followed by [employer].  PLAN OF CARE: Discharge to home after PACU  PATIENT DISPOSITION:  PACU - hemodynamically stable.

## 2020-03-12 NOTE — Anesthesia Postprocedure Evaluation (Signed)
Anesthesia Post Note  Patient: Wendy Odom  Procedure(s) Performed: Left partial knee replacement (Left Knee)  Patient location during evaluation: PACU Anesthesia Type: General Level of consciousness: awake and alert and oriented Pain management: pain level controlled Vital Signs Assessment: post-procedure vital signs reviewed and stable Respiratory status: spontaneous breathing, nonlabored ventilation and respiratory function stable Cardiovascular status: blood pressure returned to baseline and stable Postop Assessment: no signs of nausea or vomiting Anesthetic complications: no   No complications documented.   Last Vitals:  Vitals:   03/12/20 1031 03/12/20 1042  BP: (!) 130/53 (!) 135/52  Pulse: 61 60  Resp: 20 18  Temp: (!) 36.2 C (!) 36.3 C  SpO2: 94% 97%    Last Pain:  Vitals:   03/12/20 1042  TempSrc: Temporal  PainSc: 5                  Milliana Reddoch

## 2020-03-12 NOTE — Anesthesia Preprocedure Evaluation (Signed)
Anesthesia Evaluation  Patient identified by MRN, date of birth, ID band Patient awake    Reviewed: Allergy & Precautions, NPO status , Patient's Chart, lab work & pertinent test results  History of Anesthesia Complications Negative for: history of anesthetic complications  Airway Mallampati: II  TM Distance: >3 FB Neck ROM: Full    Dental  (+) Edentulous Lower, Edentulous Upper   Pulmonary neg sleep apnea, neg COPD, former smoker,    breath sounds clear to auscultation- rhonchi (-) wheezing      Cardiovascular hypertension, Pt. on medications + CAD, + Past MI and + Cardiac Stents (2011)  (-) CABG  Rhythm:Regular Rate:Normal - Systolic murmurs and - Diastolic murmurs    Neuro/Psych neg Seizures negative neurological ROS  negative psych ROS   GI/Hepatic negative GI ROS, Neg liver ROS,   Endo/Other  diabetes, Oral Hypoglycemic Agents  Renal/GU negative Renal ROS     Musculoskeletal  (+) Arthritis ,   Abdominal (+) + obese,   Peds  Hematology negative hematology ROS (+)   Anesthesia Other Findings Past Medical History: No date: Arthritis     Comment:  knee No date: Diabetes mellitus, type 2 (HCC) No date: History of kidney stones No date: Hypercholesteremia No date: Hypertension 2011: Myocardial infarction (HCC) No date: Vertigo No date: Wears dentures     Comment:  full upper and lower   Reproductive/Obstetrics                             Anesthesia Physical Anesthesia Plan  ASA: III  Anesthesia Plan: General   Post-op Pain Management:    Induction: Intravenous  PONV Risk Score and Plan: 2 and Ondansetron and Dexamethasone  Airway Management Planned: Oral ETT  Additional Equipment:   Intra-op Plan:   Post-operative Plan: Extubation in OR  Informed Consent: I have reviewed the patients History and Physical, chart, labs and discussed the procedure including the risks,  benefits and alternatives for the proposed anesthesia with the patient or authorized representative who has indicated his/her understanding and acceptance.     Dental advisory given  Plan Discussed with: CRNA and Anesthesiologist  Anesthesia Plan Comments:         Anesthesia Quick Evaluation

## 2020-03-12 NOTE — Transfer of Care (Signed)
Immediate Anesthesia Transfer of Care Note  Patient: Wendy Odom  Procedure(s) Performed: Left partial knee replacement (Left Knee)  Patient Location: PACU  Anesthesia Type:General  Level of Consciousness: awake, alert  and oriented  Airway & Oxygen Therapy: Patient Spontanous Breathing and Patient connected to face mask oxygen  Post-op Assessment: Report given to RN and Post -op Vital signs reviewed and stable  Post vital signs: Reviewed and stable  Last Vitals:  Vitals Value Taken Time  BP    Temp    Pulse    Resp    SpO2      Last Pain:  Vitals:   03/12/20 0625  TempSrc: Tympanic  PainSc: 0-No pain         Complications: No complications documented.

## 2020-03-12 NOTE — Evaluation (Signed)
Physical Therapy Evaluation Patient Details Name: Wendy Odom MRN: [id number] DOB: 1941/02/18 Today's Date: 03/12/2020   History of Present Illness  Wendy Odom is an 79 y/o female who is POD# 0 of a L partial knee replacement. PMH includes CAD, DN II, HLD, HTN, and hx of kidney stones.  Clinical Impression  Pt received in recliner chair in post-op and agreeable to PT evaluation. Education provided on polar care and use of cryotherapy for pain management and swelling, car transfers, not placing pillow underneath L knee, frequency of HEP and how to progress repetitions. Exercise and safety tips packet administered and reviewed. Pt performed therex seated in recliner chair and required verbal cues for correct technique and required min A for SLR only. L knee AROM: 0-95 degrees. Pt ambulated 200 feet using RW with CGA for safety. Chair follow provided. Just prior to performing stair negotiation, pt became hypoglycemic and exhibited body shakes. Pt provided orange juice and peanut butter crackers. Pt monitored for safety with pt reporting feeling better after ~10 minutes and body shakes no longer present. Pt then reported need to void and performed transfer to/from low commode and noted to utilize grab bar heavily when attempting to stand requiring CGA to come into full upright stance. Afterward, pt negotiated 4 steps using single HR with CGA for safety. Pt demonstrated no knee buckling or LOB during transfers, gait, or stair negotiation. Pt's wound vac noted to be beeping throughout session. Nsg notified who contacted OR RN to assess. Pt would benefit from skilled PT this acute stay. Recommend HHPT at discharge to optimize return to PLOF and maximize functional mobility, safety, and independence.  This entire session was guided, instructed, and directly supervised by Elizabeth Palau, DPT.     Follow Up Recommendations Home health PT    Equipment Recommendations  Rolling walker with 5" wheels     Recommendations for Other Services       Precautions / Restrictions Precautions Precautions: Fall Restrictions Weight Bearing Restrictions: Yes LLE Weight Bearing: Weight bearing as tolerated      Mobility  Bed Mobility               General bed mobility comments: not performed - pt seated in recliner upon arrival  Transfers Overall transfer level: Needs assistance Equipment used: Rolling walker (2 wheeled) Transfers: Sit to/from Stand Sit to Stand: Min guard;Supervision         General transfer comment: CGA initially to steady as pt transitioned hands from armrests to RW; pt progressed to SBA. Pt required increased BUE pulling on grabbar to stand from low commode with CGA for boosting hips to stand.  Ambulation/Gait Ambulation/Gait assistance: Min guard Gait Distance (Feet): 200 Feet Assistive device: Rolling walker (2 wheeled) Gait Pattern/deviations: Step-through pattern Gait velocity: decreased   General Gait Details: Pt ambulated with reciprocal pattern with decreased L knee flexion when transitioning from stance to swing phase; decreased LLE clearance in swing phase however noted good knee extension and heel strike on initial contact  Stairs Stairs: Yes Stairs assistance: Min guard Stair Management: One rail Left;Step to pattern;Forwards Number of Stairs: 4 General stair comments: Pt provided demonstration and instructed on correct sequencing of stair negotiation; pt able to complete with CGA utilizing R handrail. Noted no LOB or knee buckling.  Wheelchair Mobility    Modified Rankin (Stroke Patients Only)       Balance Overall balance assessment: Needs assistance Sitting-balance support: Feet supported Sitting balance-Leahy Scale: Good Sitting balance - Comments: no  overt LOB when back unsupported in recliner chair   Standing balance support: Bilateral upper extremity supported;During functional activity;No upper extremity supported Standing  balance-Leahy Scale: Good Standing balance comment: good static standing balance without BUE support with SBA for safety; BUE support on RW with ambulation with CGA for safety                             Pertinent Vitals/Pain Pain Assessment: 0-10 Pain Score: 3  Pain Location: L knee Pain Descriptors / Indicators: Discomfort;Sore Pain Intervention(s): Monitored during session;Repositioned;Limited activity within patient's tolerance    Home Living Family/patient expects to be discharged to:: Private residence Living Arrangements: Spouse/significant other Available Help at Discharge: Family;Available PRN/intermittently Type of Home: House Home Access: Stairs to enter Entrance Stairs-Rails: Right Entrance Stairs-Number of Steps: 1 or 3 depending on entrance Home Layout: One level Home Equipment: Cane - single point;Bedside commode;Other (comment) (RW already brought to room)      Prior Function Level of Independence: Independent with assistive device(s)         Comments: Pt reports ambulating with SPC prior to surgery secondary to L knee pain, utilized BUE for transfers, and reports 1 fall in last 6 nmonths which occurred in her bathroom.     Hand Dominance        Extremity/Trunk Assessment   Upper Extremity Assessment Upper Extremity Assessment: Overall WFL for tasks assessed;Generalized weakness (grossly 4- to 4/5 bilaterally)    Lower Extremity Assessment Lower Extremity Assessment: Overall WFL for tasks assessed;Generalized weakness;LLE deficits/detail (RLE grossly 4- to 4/5) LLE Deficits / Details: surgical leg; pt unable to perform active SLR without assistance however able to perform active dorsi/plantaflexion, knee flexion/extension, and hip flexion/ext LLE Sensation: WNL    Cervical / Trunk Assessment Cervical / Trunk Assessment: Normal  Communication   Communication: No difficulties  Cognition Arousal/Alertness: Awake/alert Behavior During  Therapy: WFL for tasks assessed/performed Overall Cognitive Status: Within Functional Limits for tasks assessed                                 General Comments: Pt A&O x 4 this afternoon.      General Comments      Exercises Total Joint Exercises Goniometric ROM: L knee AROM: 0-95 defrees Other Exercises Other Exercises: in sitting: AP, QS, heel slides, hip ab/add, SAQ, and LAQ x 10 on LLE; min A required for SLR   Assessment/Plan    PT Assessment Patient needs continued PT services  PT Problem List Decreased strength;Decreased range of motion;Decreased activity tolerance;Decreased balance;Decreased mobility;Pain       PT Treatment Interventions DME instruction;Gait training;Stair training;Functional mobility training;Therapeutic activities;Therapeutic exercise;Balance training;Patient/family education    PT Goals (Current goals can be found in the Care Plan section)  Acute Rehab PT Goals Patient Stated Goal: to get stronger  PT Goal Formulation: With patient Time For Goal Achievement: 03/26/20 Potential to Achieve Goals: Good    Frequency BID   Barriers to discharge        Co-evaluation               AM-PAC PT "6 Clicks" Mobility  Outcome Measure Help needed turning from your back to your side while in a flat bed without using bedrails?: None Help needed moving from lying on your back to sitting on the side of a flat bed without using bedrails?: A Little Help needed  moving to and from a bed to a chair (including a wheelchair)?: A Little Help needed standing up from a chair using your arms (e.g., wheelchair or bedside chair)?: A Little Help needed to walk in hospital room?: A Little Help needed climbing 3-5 steps with a railing? : A Little 6 Click Score: 19    End of Session Equipment Utilized During Treatment: Gait belt Activity Tolerance: Patient tolerated treatment well Patient left: in chair;with nursing/sitter in room Nurse  Communication: Mobility status PT Visit Diagnosis: Unsteadiness on feet (R26.81);Other abnormalities of gait and mobility (R26.89);Muscle weakness (generalized) (M62.81);History of falling (Z91.81);Pain Pain - Right/Left: Left Pain - part of body: Knee    Time: 1344-1455 PT Time Calculation (min) (ACUTE ONLY): 71 min   Charges:              Frederich Chick, SPT  Frederich Chick 03/12/2020, 4:31 PM

## 2020-03-13 ENCOUNTER — Encounter: Payer: Self-pay | Admitting: Orthopedic Surgery

## 2020-03-13 DIAGNOSIS — E119 Type 2 diabetes mellitus without complications: Secondary | ICD-10-CM | POA: Diagnosis not present

## 2020-03-13 DIAGNOSIS — E785 Hyperlipidemia, unspecified: Secondary | ICD-10-CM | POA: Diagnosis not present

## 2020-03-13 DIAGNOSIS — M1991 Primary osteoarthritis, unspecified site: Secondary | ICD-10-CM | POA: Diagnosis not present

## 2020-03-13 DIAGNOSIS — Z471 Aftercare following joint replacement surgery: Secondary | ICD-10-CM | POA: Diagnosis not present

## 2020-03-13 DIAGNOSIS — I2510 Atherosclerotic heart disease of native coronary artery without angina pectoris: Secondary | ICD-10-CM | POA: Diagnosis not present

## 2020-03-13 DIAGNOSIS — Z6836 Body mass index (BMI) 36.0-36.9, adult: Secondary | ICD-10-CM | POA: Diagnosis not present

## 2020-03-13 DIAGNOSIS — I10 Essential (primary) hypertension: Secondary | ICD-10-CM | POA: Diagnosis not present

## 2020-03-13 DIAGNOSIS — Z87891 Personal history of nicotine dependence: Secondary | ICD-10-CM | POA: Diagnosis not present

## 2020-03-15 DIAGNOSIS — M1991 Primary osteoarthritis, unspecified site: Secondary | ICD-10-CM | POA: Diagnosis not present

## 2020-03-15 DIAGNOSIS — I10 Essential (primary) hypertension: Secondary | ICD-10-CM | POA: Diagnosis not present

## 2020-03-15 DIAGNOSIS — Z87891 Personal history of nicotine dependence: Secondary | ICD-10-CM | POA: Diagnosis not present

## 2020-03-15 DIAGNOSIS — E785 Hyperlipidemia, unspecified: Secondary | ICD-10-CM | POA: Diagnosis not present

## 2020-03-15 DIAGNOSIS — Z6836 Body mass index (BMI) 36.0-36.9, adult: Secondary | ICD-10-CM | POA: Diagnosis not present

## 2020-03-15 DIAGNOSIS — I2510 Atherosclerotic heart disease of native coronary artery without angina pectoris: Secondary | ICD-10-CM | POA: Diagnosis not present

## 2020-03-15 DIAGNOSIS — E119 Type 2 diabetes mellitus without complications: Secondary | ICD-10-CM | POA: Diagnosis not present

## 2020-03-15 DIAGNOSIS — Z471 Aftercare following joint replacement surgery: Secondary | ICD-10-CM | POA: Diagnosis not present

## 2020-03-18 DIAGNOSIS — E119 Type 2 diabetes mellitus without complications: Secondary | ICD-10-CM | POA: Diagnosis not present

## 2020-03-18 DIAGNOSIS — I10 Essential (primary) hypertension: Secondary | ICD-10-CM | POA: Diagnosis not present

## 2020-03-18 DIAGNOSIS — I2510 Atherosclerotic heart disease of native coronary artery without angina pectoris: Secondary | ICD-10-CM | POA: Diagnosis not present

## 2020-03-18 DIAGNOSIS — Z87891 Personal history of nicotine dependence: Secondary | ICD-10-CM | POA: Diagnosis not present

## 2020-03-18 DIAGNOSIS — Z6836 Body mass index (BMI) 36.0-36.9, adult: Secondary | ICD-10-CM | POA: Diagnosis not present

## 2020-03-18 DIAGNOSIS — E785 Hyperlipidemia, unspecified: Secondary | ICD-10-CM | POA: Diagnosis not present

## 2020-03-18 DIAGNOSIS — M1991 Primary osteoarthritis, unspecified site: Secondary | ICD-10-CM | POA: Diagnosis not present

## 2020-03-18 DIAGNOSIS — Z471 Aftercare following joint replacement surgery: Secondary | ICD-10-CM | POA: Diagnosis not present

## 2020-03-20 DIAGNOSIS — E785 Hyperlipidemia, unspecified: Secondary | ICD-10-CM | POA: Diagnosis not present

## 2020-03-20 DIAGNOSIS — I2510 Atherosclerotic heart disease of native coronary artery without angina pectoris: Secondary | ICD-10-CM | POA: Diagnosis not present

## 2020-03-20 DIAGNOSIS — Z87891 Personal history of nicotine dependence: Secondary | ICD-10-CM | POA: Diagnosis not present

## 2020-03-20 DIAGNOSIS — M1991 Primary osteoarthritis, unspecified site: Secondary | ICD-10-CM | POA: Diagnosis not present

## 2020-03-20 DIAGNOSIS — E119 Type 2 diabetes mellitus without complications: Secondary | ICD-10-CM | POA: Diagnosis not present

## 2020-03-20 DIAGNOSIS — Z471 Aftercare following joint replacement surgery: Secondary | ICD-10-CM | POA: Diagnosis not present

## 2020-03-20 DIAGNOSIS — I10 Essential (primary) hypertension: Secondary | ICD-10-CM | POA: Diagnosis not present

## 2020-03-20 DIAGNOSIS — Z6836 Body mass index (BMI) 36.0-36.9, adult: Secondary | ICD-10-CM | POA: Diagnosis not present

## 2020-03-22 DIAGNOSIS — Z87891 Personal history of nicotine dependence: Secondary | ICD-10-CM | POA: Diagnosis not present

## 2020-03-22 DIAGNOSIS — I10 Essential (primary) hypertension: Secondary | ICD-10-CM | POA: Diagnosis not present

## 2020-03-22 DIAGNOSIS — M1991 Primary osteoarthritis, unspecified site: Secondary | ICD-10-CM | POA: Diagnosis not present

## 2020-03-22 DIAGNOSIS — I2510 Atherosclerotic heart disease of native coronary artery without angina pectoris: Secondary | ICD-10-CM | POA: Diagnosis not present

## 2020-03-22 DIAGNOSIS — E119 Type 2 diabetes mellitus without complications: Secondary | ICD-10-CM | POA: Diagnosis not present

## 2020-03-22 DIAGNOSIS — Z471 Aftercare following joint replacement surgery: Secondary | ICD-10-CM | POA: Diagnosis not present

## 2020-03-22 DIAGNOSIS — Z6836 Body mass index (BMI) 36.0-36.9, adult: Secondary | ICD-10-CM | POA: Diagnosis not present

## 2020-03-22 DIAGNOSIS — E785 Hyperlipidemia, unspecified: Secondary | ICD-10-CM | POA: Diagnosis not present

## 2020-03-26 DIAGNOSIS — M25562 Pain in left knee: Secondary | ICD-10-CM | POA: Diagnosis not present

## 2020-03-26 DIAGNOSIS — M25462 Effusion, left knee: Secondary | ICD-10-CM | POA: Diagnosis not present

## 2020-03-29 DIAGNOSIS — M25462 Effusion, left knee: Secondary | ICD-10-CM | POA: Diagnosis not present

## 2020-03-29 DIAGNOSIS — M25562 Pain in left knee: Secondary | ICD-10-CM | POA: Diagnosis not present

## 2020-04-02 DIAGNOSIS — M25462 Effusion, left knee: Secondary | ICD-10-CM | POA: Diagnosis not present

## 2020-04-02 DIAGNOSIS — M25562 Pain in left knee: Secondary | ICD-10-CM | POA: Diagnosis not present

## 2020-04-04 DIAGNOSIS — M25462 Effusion, left knee: Secondary | ICD-10-CM | POA: Diagnosis not present

## 2020-04-04 DIAGNOSIS — M25562 Pain in left knee: Secondary | ICD-10-CM | POA: Diagnosis not present

## 2020-04-09 DIAGNOSIS — M25462 Effusion, left knee: Secondary | ICD-10-CM | POA: Diagnosis not present

## 2020-04-09 DIAGNOSIS — M25562 Pain in left knee: Secondary | ICD-10-CM | POA: Diagnosis not present

## 2020-04-16 DIAGNOSIS — M25562 Pain in left knee: Secondary | ICD-10-CM | POA: Diagnosis not present

## 2020-04-16 DIAGNOSIS — M25462 Effusion, left knee: Secondary | ICD-10-CM | POA: Diagnosis not present

## 2020-04-18 DIAGNOSIS — M25462 Effusion, left knee: Secondary | ICD-10-CM | POA: Diagnosis not present

## 2020-04-18 DIAGNOSIS — M25562 Pain in left knee: Secondary | ICD-10-CM | POA: Diagnosis not present

## 2020-04-22 DIAGNOSIS — Z96652 Presence of left artificial knee joint: Secondary | ICD-10-CM | POA: Diagnosis not present

## 2020-04-22 DIAGNOSIS — M1712 Unilateral primary osteoarthritis, left knee: Secondary | ICD-10-CM | POA: Diagnosis not present

## 2020-04-22 DIAGNOSIS — M25562 Pain in left knee: Secondary | ICD-10-CM | POA: Diagnosis not present

## 2020-04-22 DIAGNOSIS — M25462 Effusion, left knee: Secondary | ICD-10-CM | POA: Diagnosis not present

## 2020-10-03 DIAGNOSIS — Z79899 Other long term (current) drug therapy: Secondary | ICD-10-CM | POA: Diagnosis not present

## 2020-10-03 DIAGNOSIS — E7800 Pure hypercholesterolemia, unspecified: Secondary | ICD-10-CM | POA: Diagnosis not present

## 2020-10-10 DIAGNOSIS — Z0000 Encounter for general adult medical examination without abnormal findings: Secondary | ICD-10-CM | POA: Diagnosis not present

## 2020-10-10 DIAGNOSIS — E1122 Type 2 diabetes mellitus with diabetic chronic kidney disease: Secondary | ICD-10-CM | POA: Diagnosis not present

## 2020-10-10 DIAGNOSIS — E1142 Type 2 diabetes mellitus with diabetic polyneuropathy: Secondary | ICD-10-CM | POA: Diagnosis not present

## 2020-10-10 DIAGNOSIS — Z1331 Encounter for screening for depression: Secondary | ICD-10-CM | POA: Diagnosis not present

## 2020-10-10 DIAGNOSIS — N189 Chronic kidney disease, unspecified: Secondary | ICD-10-CM | POA: Diagnosis not present

## 2020-10-10 DIAGNOSIS — Z79899 Other long term (current) drug therapy: Secondary | ICD-10-CM | POA: Diagnosis not present

## 2020-11-25 DIAGNOSIS — Z96652 Presence of left artificial knee joint: Secondary | ICD-10-CM | POA: Diagnosis not present

## 2020-11-25 DIAGNOSIS — M1712 Unilateral primary osteoarthritis, left knee: Secondary | ICD-10-CM | POA: Diagnosis not present

## 2021-01-22 ENCOUNTER — Emergency Department
Admission: EM | Admit: 2021-01-22 | Discharge: 2021-01-22 | Disposition: A | Discharge status: Home / Self Care | Payer: Medicare HMO | Attending: Emergency Medicine | Admitting: Emergency Medicine

## 2021-01-22 ENCOUNTER — Other Ambulatory Visit: Payer: Self-pay

## 2021-01-22 ENCOUNTER — Emergency Department: Payer: Medicare HMO

## 2021-01-22 ENCOUNTER — Encounter: Payer: Self-pay | Admitting: Emergency Medicine

## 2021-01-22 DIAGNOSIS — E119 Type 2 diabetes mellitus without complications: Secondary | ICD-10-CM | POA: Insufficient documentation

## 2021-01-22 DIAGNOSIS — R42 Dizziness and giddiness: Secondary | ICD-10-CM | POA: Diagnosis not present

## 2021-01-22 DIAGNOSIS — R112 Nausea with vomiting, unspecified: Secondary | ICD-10-CM | POA: Insufficient documentation

## 2021-01-22 DIAGNOSIS — Z7982 Long term (current) use of aspirin: Secondary | ICD-10-CM | POA: Insufficient documentation

## 2021-01-22 DIAGNOSIS — J0100 Acute maxillary sinusitis, unspecified: Secondary | ICD-10-CM | POA: Insufficient documentation

## 2021-01-22 DIAGNOSIS — R110 Nausea: Secondary | ICD-10-CM | POA: Diagnosis not present

## 2021-01-22 DIAGNOSIS — I10 Essential (primary) hypertension: Secondary | ICD-10-CM | POA: Insufficient documentation

## 2021-01-22 DIAGNOSIS — Z79899 Other long term (current) drug therapy: Secondary | ICD-10-CM | POA: Insufficient documentation

## 2021-01-22 DIAGNOSIS — Z87891 Personal history of nicotine dependence: Secondary | ICD-10-CM | POA: Insufficient documentation

## 2021-01-22 DIAGNOSIS — Z7902 Long term (current) use of antithrombotics/antiplatelets: Secondary | ICD-10-CM | POA: Diagnosis not present

## 2021-01-22 DIAGNOSIS — Z7984 Long term (current) use of oral hypoglycemic drugs: Secondary | ICD-10-CM | POA: Diagnosis not present

## 2021-01-22 DIAGNOSIS — R220 Localized swelling, mass and lump, head: Secondary | ICD-10-CM | POA: Diagnosis not present

## 2021-01-22 LAB — CBC WITH DIFFERENTIAL/PLATELET
Abs Immature Granulocytes: 0.04 10*3/uL (ref 0.00–0.07)
Basophils Absolute: 0.1 10*3/uL (ref 0.0–0.1)
Basophils Relative: 1 %
Eosinophils Absolute: 0.1 10*3/uL (ref 0.0–0.5)
Eosinophils Relative: 2 %
HCT: 37.5 % (ref 36.0–46.0)
Hemoglobin: 12.3 g/dL (ref 12.0–15.0)
Immature Granulocytes: 1 %
Lymphocytes Relative: 35 %
Lymphs Abs: 2.5 10*3/uL (ref 0.7–4.0)
MCH: 28 pg (ref 26.0–34.0)
MCHC: 32.8 g/dL (ref 30.0–36.0)
MCV: 85.4 fL (ref 80.0–100.0)
Monocytes Absolute: 0.4 10*3/uL (ref 0.1–1.0)
Monocytes Relative: 6 %
Neutro Abs: 4.1 10*3/uL (ref 1.7–7.7)
Neutrophils Relative %: 55 %
Platelets: 293 10*3/uL (ref 150–400)
RBC: 4.39 MIL/uL (ref 3.87–5.11)
RDW: 13.7 % (ref 11.5–15.5)
WBC: 7.2 10*3/uL (ref 4.0–10.5)
nRBC: 0 % (ref 0.0–0.2)

## 2021-01-22 LAB — COMPREHENSIVE METABOLIC PANEL
ALT: 13 U/L (ref 0–44)
AST: 16 U/L (ref 15–41)
Albumin: 3.8 g/dL (ref 3.5–5.0)
Alkaline Phosphatase: 77 U/L (ref 38–126)
Anion gap: 11 (ref 5–15)
BUN: 13 mg/dL (ref 8–23)
CO2: 24 mmol/L (ref 22–32)
Calcium: 9.1 mg/dL (ref 8.9–10.3)
Chloride: 104 mmol/L (ref 98–111)
Creatinine, Ser: 0.77 mg/dL (ref 0.44–1.00)
GFR, Estimated: >60 mL/min (ref >60)
Glucose, Bld: 265 mg/dL — ABNORMAL HIGH (ref 70–99)
Potassium: 3.2 mmol/L — ABNORMAL LOW (ref 3.5–5.1)
Sodium: 139 mmol/L (ref 135–145)
Total Bilirubin: 0.7 mg/dL (ref 0.3–1.2)
Total Protein: 6.5 g/dL (ref 6.5–8.1)

## 2021-01-22 LAB — TROPONIN I (HIGH SENSITIVITY)
Troponin I (High Sensitivity): 9 ng/L (ref <18)
Troponin I (High Sensitivity): 17 ng/L (ref <18)

## 2021-01-22 MED ORDER — SALINE SPRAY 0.65 % NA SOLN: 1.0000 | NASAL | 0 refills | Status: AC | PRN

## 2021-01-22 MED ORDER — MECLIZINE HCL 12.5 MG PO TABS: 12.5000 mg | ORAL_TABLET | Freq: Three times a day (TID) | ORAL | 0 refills | Status: AC | PRN

## 2021-01-22 MED ORDER — SODIUM CHLORIDE 0.9 % IV SOLN: Freq: Once | INTRAVENOUS | Status: AC

## 2021-01-22 MED ORDER — PROCHLORPERAZINE EDISYLATE 10 MG/2ML IJ SOLN
10.0000 mg | Freq: Once | INTRAMUSCULAR | Status: AC
  Administered 2021-01-22: 10 mg via INTRAVENOUS
  Filled 2021-01-22: qty 2

## 2021-01-22 MED ORDER — DIAZEPAM 5 MG/ML IJ SOLN
2.5000 mg | Freq: Once | INTRAMUSCULAR | Status: AC
  Administered 2021-01-22: 2.5 mg via INTRAVENOUS
  Filled 2021-01-22: qty 2

## 2021-01-22 MED ORDER — AMOXICILLIN-POT CLAVULANATE 875-125 MG PO TABS: 1.0000 | ORAL_TABLET | Freq: Two times a day (BID) | ORAL | 0 refills | Status: AC

## 2021-01-22 MED ORDER — LABETALOL HCL 5 MG/ML IV SOLN
5.0000 mg | Freq: Once | INTRAVENOUS | Status: DC
  Filled 2021-01-22: qty 4

## 2021-01-22 NOTE — Discharge Instructions (Addendum)
The MRI did not show anything bad going on in the brain but it did show that you have a sinus infection.  We will give you some Augmentin antibiotic 1 twice a day with food for that.  Also get some salt water nasal spray and spray that up your nostrils 5 or 6 times a day.  That will help shrink any swelling and let the sinuses drain.  Please return for worsening pain in the sinus area fever or feeling sicker.  The MRI also showed a soft tissue mass above the right eye that has been there since 2013.  They recommend a repeat MRI of the orbit.  I will let you schedule that with your primary care doctor as you wish.  Most of the doctors in town can access the hospital computer records and review the MRI findings that way.  For the dizziness I will give you some Valium 1 pill twice a day.  Be careful it can make you woozy do not fall.  I will also give you the Antivert or meclizine antihistamine you can try that to see if it works better than what you have.  Please again return if you feel sicker or the dizziness gets worse or you are having any other problems.  Have your regular doctor check on you in the next week or so.

## 2021-01-22 NOTE — ED Triage Notes (Signed)
Patient to ED via [facility] from home for dizziness and nausea. Patient has hx of vertigo and took 2 compazine at home PTA. Patient alert and orientated.

## 2021-01-22 NOTE — ED Notes (Signed)
Patient ambulating to bathroom without assistance. Patient states dizziness is somewhat better. Patient awaiting husband for discharge.

## 2021-01-22 NOTE — ED Provider Notes (Signed)
[facility] Emergency Department Provider Note   ____________________________________________   Event Date/Time   First MD Initiated Contact with Patient 01/22/21 [phone]     (approximate)  I have reviewed the triage vital signs and the nursing notes.   HISTORY  Chief Complaint Dizziness   HPI Wendy Odom is a 80 y.o. female who developed vertigo and nausea with vomiting this morning.  She took 2 Dramamine at home but it did not help.  She has had vertigo in the past but not for many years.  She does not have a headache at the present time.  Vertigo is made worse by any movement or laying backwards she is not having any weakness or numbness or uncoordination.  Just the vertigo and nausea and vomiting.  She does have a past history of diabetes hypertension MI high cholesterol and kidney stones.        Past Medical History:  Diagnosis Date   Arthritis    knee   Diabetes mellitus, type 2 (HCC)    History of kidney stones    Hypercholesteremia    Hypertension    Myocardial infarction [facility]) 2011   Vertigo    Wears dentures    full upper and lower    There are no problems to display for this patient.   Past Surgical History:  Procedure Laterality Date   CARDIAC CATHETERIZATION  2011   2 stents - [facility]   CATARACT EXTRACTION W/PHACO Right 07/28/2017   Procedure: CATARACT EXTRACTION PHACO AND INTRAOCULAR LENS PLACEMENT (IOC) RIGHT DIABETIC;  Surgeon: Lockie Mola, MD;  Location: [facility];  Service: Ophthalmology;  Laterality: Right;  diabetic - oral meds   PARTIAL KNEE ARTHROPLASTY Left 03/12/2020   Procedure: Left partial knee replacement;  Surgeon: Kennedy Bucker, MD;  Location: [facility] ORS;  Service: Orthopedics;  Laterality: Left;   TUBAL LIGATION      Prior to Admission medications   Medication Sig Start Date End Date Taking? Authorizing Provider  amoxicillin-clavulanate (AUGMENTIN) 875-125 MG tablet Take 1 tablet by mouth 2  (two) times daily for 10 days. 01/22/21 02/01/21 Yes Arnaldo Natal, MD  meclizine (ANTIVERT) 12.5 MG tablet Take 1 tablet (12.5 mg total) by mouth 3 (three) times daily as needed for dizziness. 01/22/21  Yes Arnaldo Natal, MD  sodium chloride (OCEAN) 0.65 % SOLN nasal spray Place 1 spray into both nostrils as needed for congestion (5-6 times a day to help treat sinus infection). 01/22/21  Yes Arnaldo Natal, MD  acetaminophen (TYLENOL) 500 MG tablet Take 1,000 mg by mouth every 6 (six) hours as needed for mild pain.    [provider]  aspirin 81 MG tablet Take 81 mg by mouth daily.    [provider]  atorvastatin (LIPITOR) 40 MG tablet Take 40 mg by mouth daily.    [provider]  clopidogrel (PLAVIX) 75 MG tablet Take 75 mg by mouth daily.    [provider]  glipiZIDE (GLUCOTROL XL) 10 MG 24 hr tablet Take 10 mg by mouth daily. If BS is above 200 01/01/20   [provider]  hydrochlorothiazide (HYDRODIURIL) 25 MG tablet Take 25 mg by mouth daily. 02/12/20   [provider]  HYDROcodone-acetaminophen (NORCO) 5-325 MG tablet Take 1-2 tablets by mouth every 6 (six) hours as needed for moderate pain. 03/12/20   Kennedy Bucker, MD  losartan (COZAAR) 100 MG tablet Take 100 mg by mouth daily. 02/12/20   [provider]  metFORMIN (GLUCOPHAGE) 500 MG tablet Take 1,000 mg by mouth 2 (two) times daily with a meal.     [provider]    Allergies Glipizide and Quinapril  Family History  Problem Relation Age of Onset   Breast cancer Sister 20    Social History Social History   Tobacco Use   Smoking status: Former    Types: Cigarettes    Quit date: 2003    Years since quitting: 19.6   Smokeless tobacco: Never  Vaping Use   Vaping Use: Never used  Substance Use Topics   Alcohol use: No   Drug use: Never    Review of Systems  Constitutional: No fever/chills Eyes: No visual changes. ENT: No sore  throat. Cardiovascular: Denies chest pain. Respiratory: Denies shortness of breath. Gastrointestinal: No abdominal pain.  nausea, vomiting.  No diarrhea.  No constipation. Genitourinary: Negative for dysuria. Musculoskeletal: Negative for back pain. Skin: Negative for rash. Neurological: Negative for headaches, focal weakness ____________________________________________   PHYSICAL EXAM:  VITAL SIGNS: ED Triage Vitals  Enc Vitals Group     BP --      Pulse --      Resp --      Temp --      Temp src --      SpO2 --      Weight 01/22/21 0918 207 lb (93.9 kg)     Height 01/22/21 0918 5\' 2"  (1.575 m)     Head Circumference --      Peak Flow --      Pain Score 01/22/21 0917 0     Pain Loc --      Pain Edu? --      Excl. in GC? --     Constitutional: Alert and oriented.  Looks uncomfortable Eyes: Conjunctivae are normal. PERRL. EOMI. patient is having nystagmus actively.  Thrust testing shows that deviation and return to position of gaze.  Vertigo is made worse by reclining. Head: Atraumatic. Nose: No congestion/rhinnorhea. Mouth/Throat: Mucous membranes are moist.  Oropharynx non-erythematous. Neck: No stridor. Cardiovascular: Normal rate, regular rhythm. Grossly normal heart sounds.  Good peripheral circulation. Respiratory: Normal respiratory effort.  No retractions. Lungs CTAB. Gastrointestinal: Soft and nontender. No distention. No abdominal bruits. No CVA tenderness. Musculoskeletal: No lower extremity tenderness nor edema.  Neurologic:  Normal speech and language. No gross focal neurologic deficits are appreciated.  Cranial nerves II through XII are intact although visual fields were not checked and of course the nystagmus is present.  Finger-nose and rapid alternating movements and hands are normal.  Her strength is 5/5 throughout. Skin:  Skin is warm, dry and intact. No rash noted.   ____________________________________________   LABS (all labs ordered are listed,  but only abnormal results are displayed)  Labs Reviewed  COMPREHENSIVE METABOLIC PANEL - Abnormal; Notable for the following components:      Result Value   Potassium 3.2 (*)    Glucose, Bld 265 (*)    All other components within normal limits  CBC WITH DIFFERENTIAL/PLATELET  TROPONIN I (HIGH SENSITIVITY)  TROPONIN I (HIGH SENSITIVITY)   ____________________________________________  EKG  EKG read interpreted by me shows normal sinus rhythm rate of 67 left axis no acute ST-T wave changes large R wave in V2 goes back To being small in V3 backup in V4 likely due to lead placement. ____________________________________________  RADIOLOGY 01/24/21, personally viewed and evaluated these images (plain radiographs) as part of my medical decision making, as  well as reviewing the written report by the radiologist.  ED MD interpretation: MRI read by radiology reviewed by me does not show any intracranial problems.  There is a soft tissue mass that is apparently old and sinusitis in the maxillary sinus.  Official radiology report(s): MR BRAIN WO CONTRAST  Result Date: 01/22/2021 CLINICAL DATA:  Dizziness, persistent/recurrent, cardiac or vascular cause suspected EXAM: MRI HEAD WITHOUT CONTRAST TECHNIQUE: Multiplanar, multiecho pulse sequences of the brain and surrounding structures were obtained without intravenous contrast. COMPARISON:  Ct head 02/28/2012. FINDINGS: Brain: No acute infarction, hemorrhage, hydrocephalus, extra-axial collection or mass lesion. Mild scattered T2 hyperintensities within the supratentorial and pontine white matter, nonspecific but compatible with chronic microvascular ischemic disease. Vascular: Major arterial flow voids are maintained at the skull base. Skull and upper cervical spine: Normal marrow signal. Degenerative changes in the visualized upper cervical spine. Sinuses/Orbits: Moderate left maxillary sinus mucosal thickening with frothy secretions.  Heterogeneous 2.4 cm lobulated mass in the superolateral right orbit (series 10, image 11; series 15, image 24). Other: No mastoid effusions. IMPRESSION: 1. No evidence of acute intracranial abnormality. 2. Approximately 2.4 cm mass in the superolateral right orbit, which in retrospect was present on remote 2013 CT head. Recommend MRI of the orbits with contrast to further characterize. 3. Mild for age chronic microvascular ischemic disease. 4. Moderate left maxillary sinus mucosal thickening with frothy secretions. Correlate with signs/symptoms of sinusitis. Electronically Signed   By: Feliberto Harts M.D.   On: 01/22/2021 13:43    ____________________________________________   PROCEDURES  Procedure(s) performed (including Critical Care):  Procedures   ____________________________________________   INITIAL IMPRESSION / ASSESSMENT AND PLAN / ED COURSE  ----------------------------------------- 10:03 AM on 01/22/2021 ----------------------------------------- His blood pressure is now gone down to 158 systolic.  We will hold labetalol.  Patient's vertigo is improved somewhat we will try to get the MRI.  If the MRI is negative and vertigo is okay we should be able to let her go hopefully.  That would mean it would be peripheral vertigo.    ----------------------------------------- 4:02 PM on 01/22/2021 ----------------------------------------- I have discussed the MRI findings with the patient.  She prefers to follow-up outpatient for the orbital MRI.  We will try to treat her sinus infection when she does admit to having some facial pain with antibiotics and salt water nasal spray.  Her dizziness is much better and she is able to walk around now.  We will let her go.  She is very happy with about that.          ____________________________________________   FINAL CLINICAL IMPRESSION(S) / ED DIAGNOSES  Final diagnoses:  Vertigo  Acute maxillary sinusitis, recurrence not  specified     ED Discharge Orders          Ordered    amoxicillin-clavulanate (AUGMENTIN) 875-125 MG tablet  2 times daily        01/22/21 1410    meclizine (ANTIVERT) 12.5 MG tablet  3 times daily PRN        01/22/21 1410    sodium chloride (OCEAN) 0.65 % SOLN nasal spray  As needed       Note to Pharmacy: Dispense 1 bottle   01/22/21 1410             Note:  This document was prepared using Dragon voice recognition software and may include unintentional dictation errors.    Arnaldo Natal, MD 01/22/21 [phone]

## 2021-01-22 NOTE — ED Notes (Signed)
Patient to MRI at this time.

## 2021-01-30 DIAGNOSIS — J0100 Acute maxillary sinusitis, unspecified: Secondary | ICD-10-CM | POA: Diagnosis not present

## 2021-01-30 DIAGNOSIS — H0589 Other disorders of orbit: Secondary | ICD-10-CM | POA: Diagnosis not present

## 2021-02-12 DIAGNOSIS — Z23 Encounter for immunization: Secondary | ICD-10-CM | POA: Diagnosis not present

## 2021-04-08 DIAGNOSIS — E1165 Type 2 diabetes mellitus with hyperglycemia: Secondary | ICD-10-CM | POA: Diagnosis not present

## 2021-04-08 DIAGNOSIS — Z79899 Other long term (current) drug therapy: Secondary | ICD-10-CM | POA: Diagnosis not present

## 2021-04-08 DIAGNOSIS — E7800 Pure hypercholesterolemia, unspecified: Secondary | ICD-10-CM | POA: Diagnosis not present

## 2021-04-08 DIAGNOSIS — N182 Chronic kidney disease, stage 2 (mild): Secondary | ICD-10-CM | POA: Diagnosis not present

## 2021-04-14 DIAGNOSIS — I10 Essential (primary) hypertension: Secondary | ICD-10-CM | POA: Diagnosis not present

## 2021-04-14 DIAGNOSIS — E785 Hyperlipidemia, unspecified: Secondary | ICD-10-CM | POA: Diagnosis not present

## 2021-04-14 DIAGNOSIS — E119 Type 2 diabetes mellitus without complications: Secondary | ICD-10-CM | POA: Diagnosis not present

## 2021-04-14 DIAGNOSIS — Z79899 Other long term (current) drug therapy: Secondary | ICD-10-CM | POA: Diagnosis not present

## 2021-08-12 DIAGNOSIS — N189 Chronic kidney disease, unspecified: Secondary | ICD-10-CM | POA: Diagnosis not present

## 2021-08-12 DIAGNOSIS — Z79899 Other long term (current) drug therapy: Secondary | ICD-10-CM | POA: Diagnosis not present

## 2021-08-12 DIAGNOSIS — E785 Hyperlipidemia, unspecified: Secondary | ICD-10-CM | POA: Diagnosis not present

## 2021-08-12 DIAGNOSIS — Z794 Long term (current) use of insulin: Secondary | ICD-10-CM | POA: Diagnosis not present

## 2021-08-12 DIAGNOSIS — I129 Hypertensive chronic kidney disease with stage 1 through stage 4 chronic kidney disease, or unspecified chronic kidney disease: Secondary | ICD-10-CM | POA: Diagnosis not present

## 2021-08-12 DIAGNOSIS — E1122 Type 2 diabetes mellitus with diabetic chronic kidney disease: Secondary | ICD-10-CM | POA: Diagnosis not present

## 2021-11-28 DIAGNOSIS — E7800 Pure hypercholesterolemia, unspecified: Secondary | ICD-10-CM | POA: Diagnosis not present

## 2021-11-28 DIAGNOSIS — N182 Chronic kidney disease, stage 2 (mild): Secondary | ICD-10-CM | POA: Diagnosis not present

## 2021-11-28 DIAGNOSIS — E1165 Type 2 diabetes mellitus with hyperglycemia: Secondary | ICD-10-CM | POA: Diagnosis not present

## 2021-11-28 DIAGNOSIS — Z79899 Other long term (current) drug therapy: Secondary | ICD-10-CM | POA: Diagnosis not present

## 2021-12-05 DIAGNOSIS — Z0000 Encounter for general adult medical examination without abnormal findings: Secondary | ICD-10-CM | POA: Diagnosis not present

## 2021-12-05 DIAGNOSIS — Z1331 Encounter for screening for depression: Secondary | ICD-10-CM | POA: Diagnosis not present

## 2021-12-05 DIAGNOSIS — I10 Essential (primary) hypertension: Secondary | ICD-10-CM | POA: Diagnosis not present

## 2021-12-05 DIAGNOSIS — Z79899 Other long term (current) drug therapy: Secondary | ICD-10-CM | POA: Diagnosis not present

## 2021-12-05 DIAGNOSIS — E119 Type 2 diabetes mellitus without complications: Secondary | ICD-10-CM | POA: Diagnosis not present

## 2022-01-20 DIAGNOSIS — Z961 Presence of intraocular lens: Secondary | ICD-10-CM | POA: Diagnosis not present

## 2022-01-20 DIAGNOSIS — H43823 Vitreomacular adhesion, bilateral: Secondary | ICD-10-CM | POA: Diagnosis not present

## 2022-01-20 DIAGNOSIS — Z0100 Encounter for examination of eyes and vision without abnormal findings: Secondary | ICD-10-CM | POA: Diagnosis not present

## 2022-01-20 DIAGNOSIS — E113211 Type 2 diabetes mellitus with mild nonproliferative diabetic retinopathy with macular edema, right eye: Secondary | ICD-10-CM | POA: Diagnosis not present

## 2022-02-03 DIAGNOSIS — H40003 Preglaucoma, unspecified, bilateral: Secondary | ICD-10-CM | POA: Diagnosis not present

## 2022-03-19 DIAGNOSIS — Z23 Encounter for immunization: Secondary | ICD-10-CM | POA: Diagnosis not present

## 2022-04-26 IMAGING — MR MR KNEE*L* W/O CM
3 series · 40 of 40 positions shown · non-contrast
Comparison: None.

CLINICAL DATA: Medial left knee pain for 3 months.

EXAM:
MRI OF THE LEFT KNEE WITHOUT CONTRAST
TECHNIQUE: Multiplanar, multisequence MR imaging of the knee was performed. No
intravenous contrast was administered.

[Series 10: T1 · coronal · left · 4.0mm · 0.59mm/px · 12 of 30 slices shown]
[im 1/30]
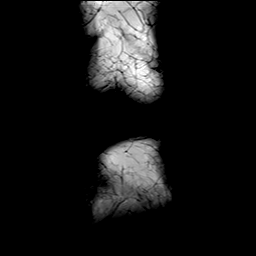
[im 3/30]
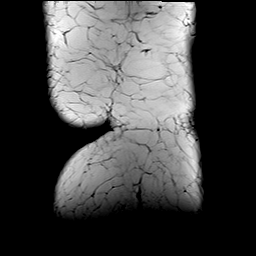
[im 6/30]
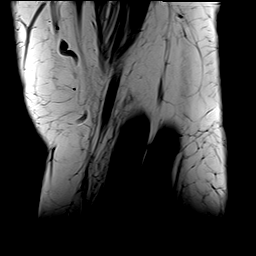
[im 8/30]
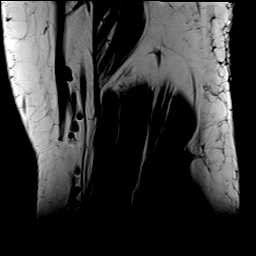
[im 11/30]
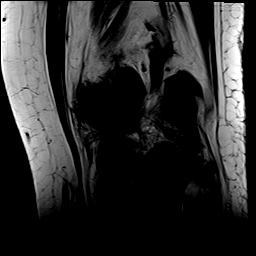
[im 14/30]
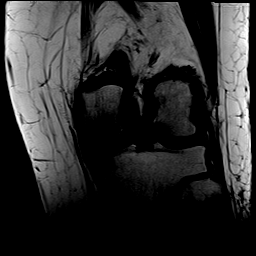
[im 16/30]
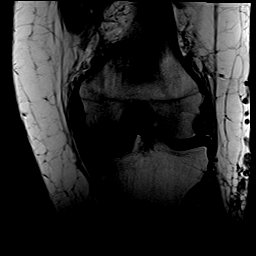
[im 19/30]
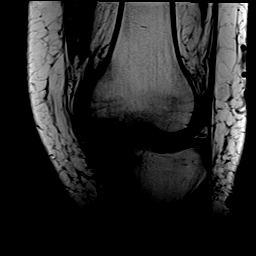
[im 22/30]
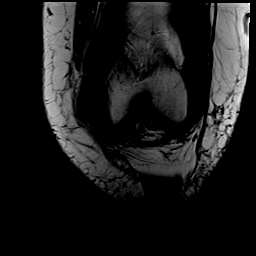
[im 24/30]
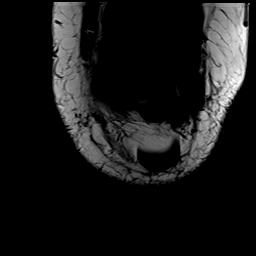
[im 27/30]
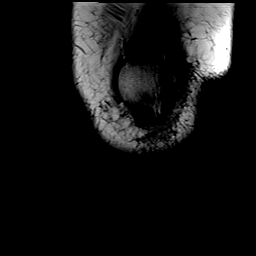
[im 30/30]
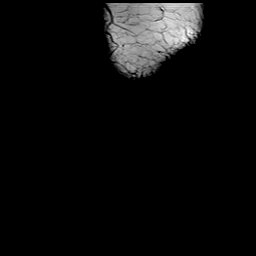

[Series 11: PD fat-sat · coronal · left · 4.0mm · 0.59mm/px · 13 of 30 slices shown]
[im 1/30]
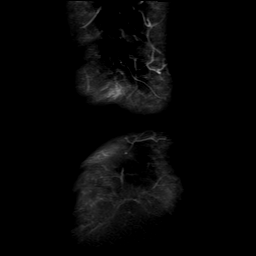
[im 3/30]
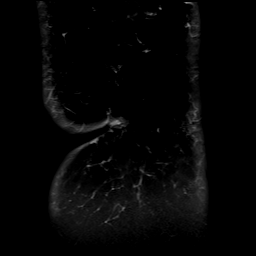
[im 5/30]
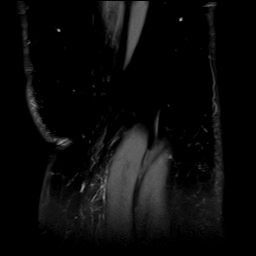
[im 8/30]
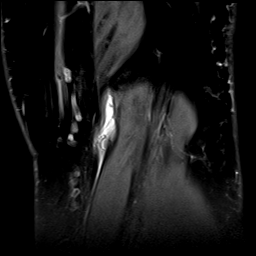
[im 10/30]
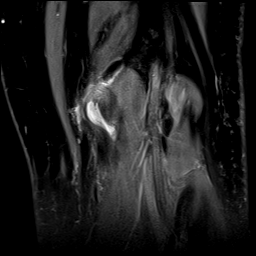
[im 13/30]
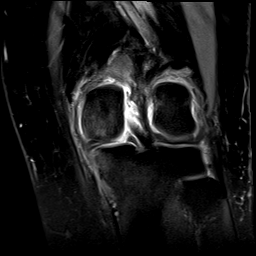
[im 15/30]
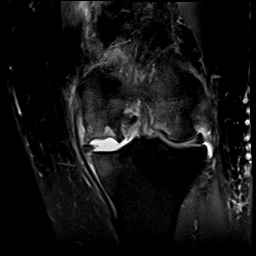
[im 17/30]
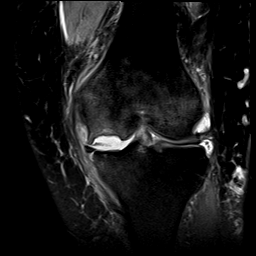
[im 20/30]
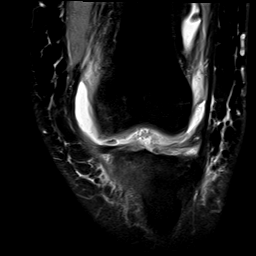
[im 22/30]
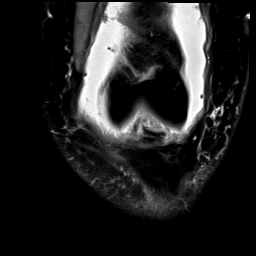
[im 25/30]
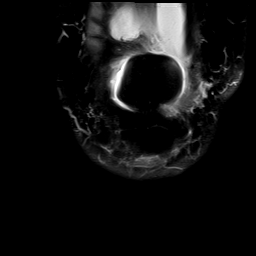
[im 27/30]
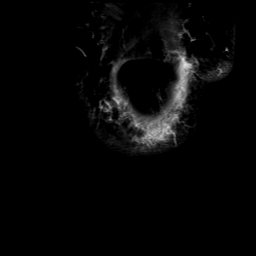
[im 30/30]
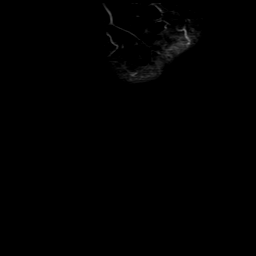

[Series 13: T2 fat-sat · sagittal · left · 3.0mm · 0.59mm/px · 15 of 35 slices shown]
[im 1/35]
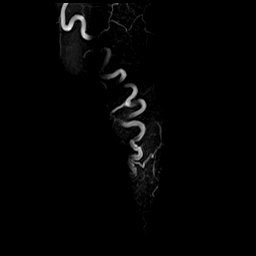
[im 3/35]
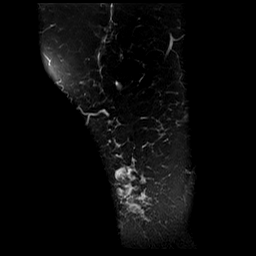
[im 5/35]
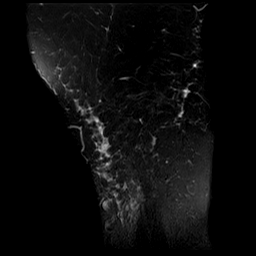
[im 8/35]
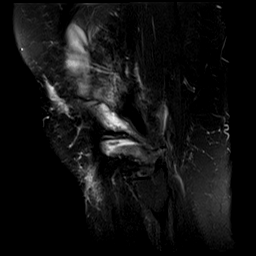
[im 10/35]
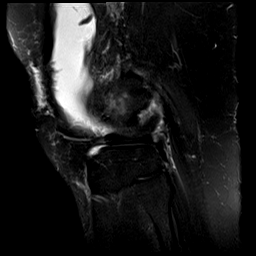
[im 13/35]
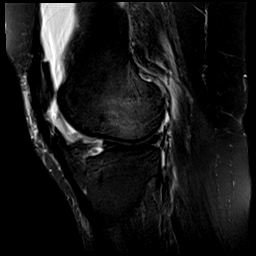
[im 15/35]
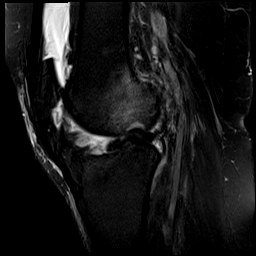
[im 18/35]
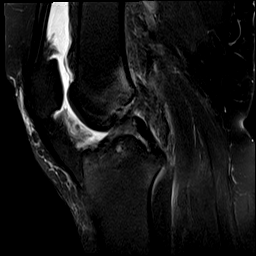
[im 20/35]
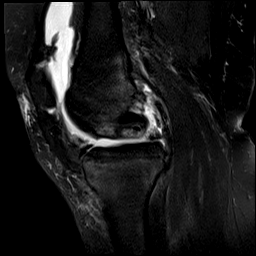
[im 22/35]
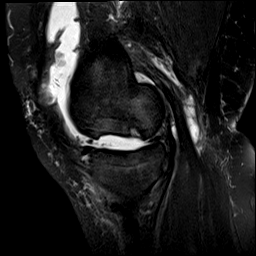
[im 25/35]
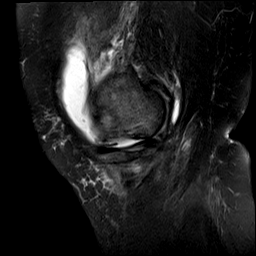
[im 27/35]
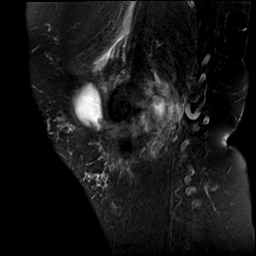
[im 30/35]
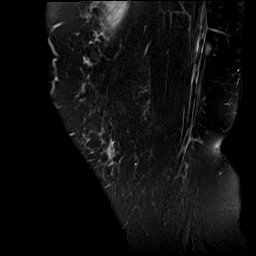
[im 32/35]
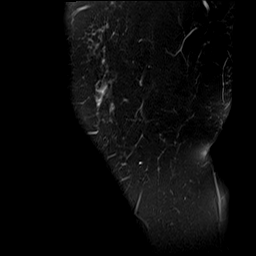
[im 35/35]
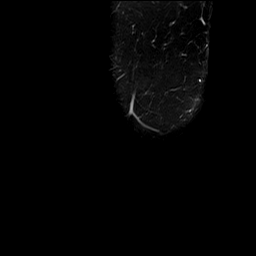

[40 of 40 positions shown; findings below may reference images not displayed]

FINDINGS: MENISCI

Medial: Complex tear of the posterior horn of the medial meniscus
with peripheral meniscal extrusion.

Lateral: Horizontal tear of the anterior horn-body junction of the
lateral meniscus extending to the superior articular surface.

LIGAMENTS

Cruciates: ACL and PCL are intact.

Collaterals: Medial collateral ligament is intact. Lateral
collateral ligament complex is intact.

CARTILAGE

Patellofemoral:  No chondral defect.

Medial: Large osteochondral defect with measuring 2.6 x 1.7 cm along
the weight-bearing surface of the medial femoral condyle with
fragmentation of the articular surface and displacement of the
fragment anteriorly. Extensive full-thickness cartilage loss of the
medial femorotibial compartment with subchondral marrow edema.

Lateral: Mild partial-thickness cartilage loss of the lateral
femorotibial compartment. Nondisplaced 5 mm osteochondral lesion
along the medial aspect of the lateral femoral condyle with
surrounding marrow edema.

JOINT: Large joint effusion. Mild edema in Hoffa's fat. No plical
thickening.

POPLITEAL FOSSA: Popliteus tendon is intact. Small Baker's cyst.

EXTENSOR MECHANISM: Intact quadriceps tendon. Intact patellar
tendon. Intact lateral patellar retinaculum. Intact medial patellar
retinaculum. Intact MPFL.

BONES: No aggressive osseous lesion. No fracture or dislocation.

Other: No fluid collection or hematoma. Muscles are normal.
IMPRESSION: 1. Complex tear of the posterior horn of the medial meniscus with
peripheral meniscal extrusion.
2. Horizontal tear of the anterior horn-body junction of the lateral
meniscus extending to the superior articular surface.
3. Large osteochondral defect with measuring 2.6 x 1.7 cm along the
weight-bearing surface of the medial femoral condyle with
fragmentation of the articular surface and displacement of the
fragment anteriorly. Extensive full-thickness cartilage loss of the
medial femorotibial compartment with subchondral marrow edema.
4. Nondisplaced 5 mm osteochondral lesion along the medial aspect of
the lateral femoral condyle with surrounding marrow edema.
5. Large joint effusion.

## 2022-05-21 IMAGING — DX DG KNEE 1-2V*L*
2 series · 2 of 2 positions shown · non-contrast
Comparison: 02/16/2020

CLINICAL DATA: Postop left knee surgery

EXAM:
LEFT KNEE - 1-2 VIEW

[knee ap]
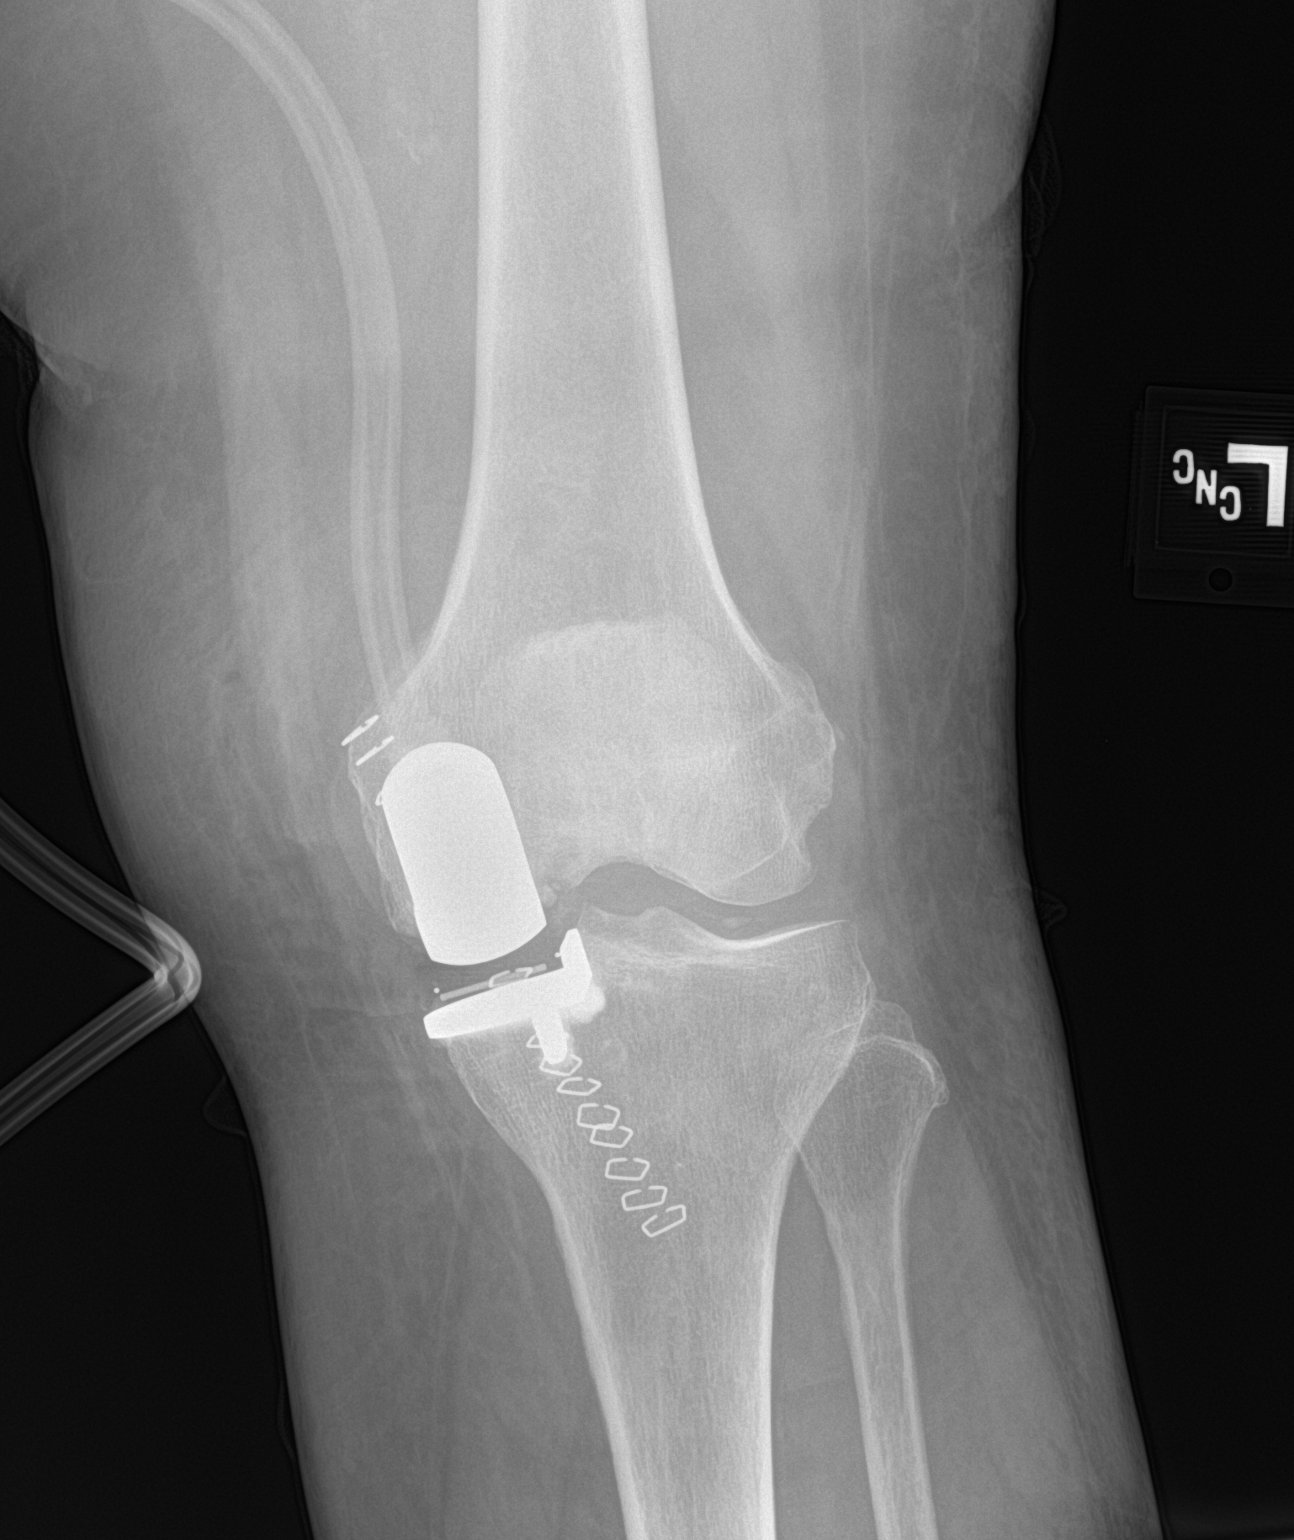

[knee lat]
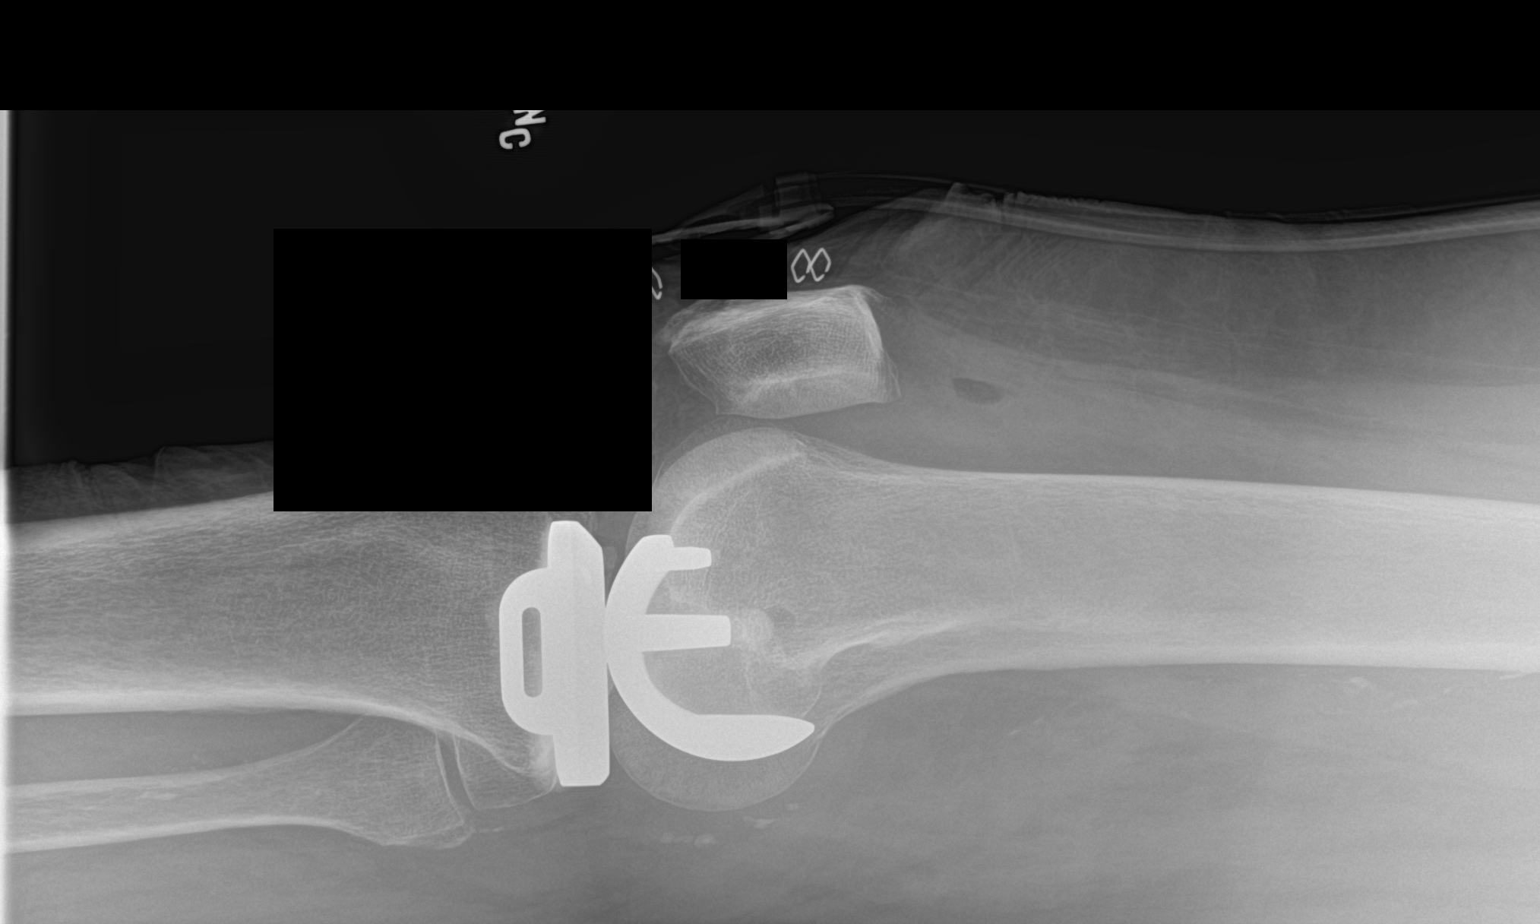

[2 of 2 positions shown; findings below may reference images not displayed]

FINDINGS: Interval postsurgical changes from a medial compartment arthroplasty
of the left knee. Arthroplasty components are in their expected
alignment without apparent complication. Expected postoperative
changes to the overlying soft tissues.
IMPRESSION: Interval postsurgical changes from medial compartment arthroplasty
of the left knee without apparent complication.

## 2022-06-02 DIAGNOSIS — Z79899 Other long term (current) drug therapy: Secondary | ICD-10-CM | POA: Diagnosis not present

## 2022-06-02 DIAGNOSIS — Z961 Presence of intraocular lens: Secondary | ICD-10-CM | POA: Diagnosis not present

## 2022-06-02 DIAGNOSIS — E1165 Type 2 diabetes mellitus with hyperglycemia: Secondary | ICD-10-CM | POA: Diagnosis not present

## 2022-06-02 DIAGNOSIS — E7800 Pure hypercholesterolemia, unspecified: Secondary | ICD-10-CM | POA: Diagnosis not present

## 2022-06-02 DIAGNOSIS — N182 Chronic kidney disease, stage 2 (mild): Secondary | ICD-10-CM | POA: Diagnosis not present

## 2022-06-02 DIAGNOSIS — H40003 Preglaucoma, unspecified, bilateral: Secondary | ICD-10-CM | POA: Diagnosis not present

## 2022-06-08 DIAGNOSIS — I10 Essential (primary) hypertension: Secondary | ICD-10-CM | POA: Diagnosis not present

## 2022-06-08 DIAGNOSIS — E119 Type 2 diabetes mellitus without complications: Secondary | ICD-10-CM | POA: Diagnosis not present

## 2022-06-08 DIAGNOSIS — E785 Hyperlipidemia, unspecified: Secondary | ICD-10-CM | POA: Diagnosis not present

## 2022-06-08 DIAGNOSIS — E1122 Type 2 diabetes mellitus with diabetic chronic kidney disease: Secondary | ICD-10-CM | POA: Diagnosis not present

## 2022-06-08 DIAGNOSIS — Z794 Long term (current) use of insulin: Secondary | ICD-10-CM | POA: Diagnosis not present

## 2022-06-08 DIAGNOSIS — Z79899 Other long term (current) drug therapy: Secondary | ICD-10-CM | POA: Diagnosis not present

## 2022-06-08 DIAGNOSIS — E1142 Type 2 diabetes mellitus with diabetic polyneuropathy: Secondary | ICD-10-CM | POA: Diagnosis not present

## 2022-12-04 DIAGNOSIS — E1165 Type 2 diabetes mellitus with hyperglycemia: Secondary | ICD-10-CM | POA: Diagnosis not present

## 2022-12-04 DIAGNOSIS — E7800 Pure hypercholesterolemia, unspecified: Secondary | ICD-10-CM | POA: Diagnosis not present

## 2022-12-04 DIAGNOSIS — Z79899 Other long term (current) drug therapy: Secondary | ICD-10-CM | POA: Diagnosis not present

## 2022-12-10 DIAGNOSIS — I10 Essential (primary) hypertension: Secondary | ICD-10-CM | POA: Diagnosis not present

## 2022-12-10 DIAGNOSIS — Z1331 Encounter for screening for depression: Secondary | ICD-10-CM | POA: Diagnosis not present

## 2022-12-10 DIAGNOSIS — Z794 Long term (current) use of insulin: Secondary | ICD-10-CM | POA: Diagnosis not present

## 2022-12-10 DIAGNOSIS — E119 Type 2 diabetes mellitus without complications: Secondary | ICD-10-CM | POA: Diagnosis not present

## 2022-12-10 DIAGNOSIS — Z0000 Encounter for general adult medical examination without abnormal findings: Secondary | ICD-10-CM | POA: Diagnosis not present

## 2023-04-07 DIAGNOSIS — R946 Abnormal results of thyroid function studies: Secondary | ICD-10-CM | POA: Diagnosis not present

## 2023-04-07 DIAGNOSIS — Z79899 Other long term (current) drug therapy: Secondary | ICD-10-CM | POA: Diagnosis not present

## 2023-04-07 DIAGNOSIS — E7800 Pure hypercholesterolemia, unspecified: Secondary | ICD-10-CM | POA: Diagnosis not present

## 2023-04-07 DIAGNOSIS — E1142 Type 2 diabetes mellitus with diabetic polyneuropathy: Secondary | ICD-10-CM | POA: Diagnosis not present

## 2023-04-13 DIAGNOSIS — E785 Hyperlipidemia, unspecified: Secondary | ICD-10-CM | POA: Diagnosis not present

## 2023-04-13 DIAGNOSIS — I10 Essential (primary) hypertension: Secondary | ICD-10-CM | POA: Diagnosis not present

## 2023-04-13 DIAGNOSIS — E119 Type 2 diabetes mellitus without complications: Secondary | ICD-10-CM | POA: Diagnosis not present

## 2023-04-13 DIAGNOSIS — Z79899 Other long term (current) drug therapy: Secondary | ICD-10-CM | POA: Diagnosis not present

## 2023-10-04 DIAGNOSIS — N182 Chronic kidney disease, stage 2 (mild): Secondary | ICD-10-CM | POA: Diagnosis not present

## 2023-10-04 DIAGNOSIS — R7989 Other specified abnormal findings of blood chemistry: Secondary | ICD-10-CM | POA: Diagnosis not present

## 2023-10-04 DIAGNOSIS — E7800 Pure hypercholesterolemia, unspecified: Secondary | ICD-10-CM | POA: Diagnosis not present

## 2023-10-04 DIAGNOSIS — Z79899 Other long term (current) drug therapy: Secondary | ICD-10-CM | POA: Diagnosis not present

## 2023-10-04 DIAGNOSIS — E1165 Type 2 diabetes mellitus with hyperglycemia: Secondary | ICD-10-CM | POA: Diagnosis not present

## 2023-10-11 DIAGNOSIS — I10 Essential (primary) hypertension: Secondary | ICD-10-CM | POA: Diagnosis not present

## 2023-10-11 DIAGNOSIS — E119 Type 2 diabetes mellitus without complications: Secondary | ICD-10-CM | POA: Diagnosis not present

## 2023-10-11 DIAGNOSIS — E785 Hyperlipidemia, unspecified: Secondary | ICD-10-CM | POA: Diagnosis not present

## 2023-10-11 DIAGNOSIS — Z79899 Other long term (current) drug therapy: Secondary | ICD-10-CM | POA: Diagnosis not present
